# Patient Record
Sex: Female | Born: 1965
Health system: Southern US, Community
[De-identification: ages and names within clinical notes are randomized; demographics above are authoritative.]

## PROBLEM LIST (undated history)

## (undated) DIAGNOSIS — Z9289 Personal history of other medical treatment: Secondary | ICD-10-CM

## (undated) DIAGNOSIS — D219 Benign neoplasm of connective and other soft tissue, unspecified: Secondary | ICD-10-CM

## (undated) DIAGNOSIS — I10 Essential (primary) hypertension: Secondary | ICD-10-CM

## (undated) DIAGNOSIS — R0602 Shortness of breath: Secondary | ICD-10-CM

## (undated) DIAGNOSIS — G43909 Migraine, unspecified, not intractable, without status migrainosus: Secondary | ICD-10-CM

## (undated) DIAGNOSIS — D649 Anemia, unspecified: Secondary | ICD-10-CM

## (undated) DIAGNOSIS — M419 Scoliosis, unspecified: Secondary | ICD-10-CM

---

## 1998-01-24 ENCOUNTER — Ambulatory Visit (HOSPITAL_COMMUNITY): Admission: RE | Admit: 1998-01-24 | Discharge: 1998-01-24 | Payer: Self-pay | Admitting: Obstetrics and Gynecology

## 1999-01-15 ENCOUNTER — Encounter: Admission: RE | Admit: 1999-01-15 | Discharge: 1999-04-15 | Payer: Self-pay | Admitting: *Deleted

## 1999-03-03 ENCOUNTER — Encounter: Payer: Self-pay | Admitting: Emergency Medicine

## 1999-03-03 ENCOUNTER — Emergency Department (HOSPITAL_COMMUNITY): Admission: EM | Admit: 1999-03-03 | Discharge: 1999-03-03 | Payer: Self-pay | Admitting: Emergency Medicine

## 1999-03-08 ENCOUNTER — Inpatient Hospital Stay (HOSPITAL_COMMUNITY): Admission: AD | Admit: 1999-03-08 | Discharge: 1999-03-08 | Payer: Self-pay | Admitting: Obstetrics and Gynecology

## 1999-03-10 ENCOUNTER — Inpatient Hospital Stay (HOSPITAL_COMMUNITY): Admission: AD | Admit: 1999-03-10 | Discharge: 1999-03-10 | Payer: Self-pay | Admitting: Obstetrics and Gynecology

## 1999-08-11 ENCOUNTER — Inpatient Hospital Stay (HOSPITAL_COMMUNITY): Admission: AD | Admit: 1999-08-11 | Discharge: 1999-08-11 | Payer: Self-pay | Admitting: Obstetrics and Gynecology

## 1999-08-11 ENCOUNTER — Encounter: Payer: Self-pay | Admitting: Obstetrics and Gynecology

## 1999-08-15 ENCOUNTER — Inpatient Hospital Stay (HOSPITAL_COMMUNITY): Admission: AD | Admit: 1999-08-15 | Discharge: 1999-08-15 | Payer: Self-pay | Admitting: Obstetrics and Gynecology

## 1999-10-07 ENCOUNTER — Emergency Department (HOSPITAL_COMMUNITY): Admission: EM | Admit: 1999-10-07 | Discharge: 1999-10-07 | Payer: Self-pay | Admitting: Emergency Medicine

## 1999-10-31 ENCOUNTER — Encounter (INDEPENDENT_AMBULATORY_CARE_PROVIDER_SITE_OTHER): Payer: Self-pay

## 1999-10-31 ENCOUNTER — Inpatient Hospital Stay (HOSPITAL_COMMUNITY): Admission: AD | Admit: 1999-10-31 | Discharge: 1999-11-04 | Payer: Self-pay | Admitting: Obstetrics and Gynecology

## 2000-10-18 ENCOUNTER — Emergency Department (HOSPITAL_COMMUNITY): Admission: EM | Admit: 2000-10-18 | Discharge: 2000-10-18 | Payer: Self-pay | Admitting: Emergency Medicine

## 2001-07-01 ENCOUNTER — Ambulatory Visit (HOSPITAL_COMMUNITY): Admission: RE | Admit: 2001-07-01 | Discharge: 2001-07-01 | Payer: Self-pay | Admitting: Family Medicine

## 2001-07-01 ENCOUNTER — Encounter: Payer: Self-pay | Admitting: Family Medicine

## 2002-01-05 ENCOUNTER — Encounter: Admission: RE | Admit: 2002-01-05 | Discharge: 2002-01-05 | Payer: Self-pay | Admitting: Family Medicine

## 2002-01-05 ENCOUNTER — Encounter: Payer: Self-pay | Admitting: Family Medicine

## 2003-06-20 ENCOUNTER — Other Ambulatory Visit: Admission: RE | Admit: 2003-06-20 | Discharge: 2003-06-20 | Payer: Self-pay | Admitting: Obstetrics and Gynecology

## 2005-12-17 ENCOUNTER — Other Ambulatory Visit: Admission: RE | Admit: 2005-12-17 | Discharge: 2005-12-17 | Payer: Self-pay | Admitting: Obstetrics and Gynecology

## 2006-02-12 ENCOUNTER — Ambulatory Visit (HOSPITAL_COMMUNITY): Admission: RE | Admit: 2006-02-12 | Discharge: 2006-02-12 | Payer: Self-pay | Admitting: Obstetrics and Gynecology

## 2006-02-19 ENCOUNTER — Encounter: Admission: RE | Admit: 2006-02-19 | Discharge: 2006-02-19 | Payer: Self-pay | Admitting: Obstetrics and Gynecology

## 2007-02-18 ENCOUNTER — Encounter: Admission: RE | Admit: 2007-02-18 | Discharge: 2007-02-18 | Payer: Self-pay | Admitting: Obstetrics and Gynecology

## 2009-05-22 ENCOUNTER — Ambulatory Visit: Payer: Self-pay | Admitting: Internal Medicine

## 2009-06-05 ENCOUNTER — Inpatient Hospital Stay: Payer: Self-pay | Admitting: Internal Medicine

## 2009-06-11 ENCOUNTER — Ambulatory Visit: Payer: Self-pay | Admitting: Internal Medicine

## 2009-06-22 ENCOUNTER — Ambulatory Visit: Payer: Self-pay | Admitting: Internal Medicine

## 2009-06-28 ENCOUNTER — Ambulatory Visit: Payer: Self-pay | Admitting: Internal Medicine

## 2009-07-23 ENCOUNTER — Ambulatory Visit: Payer: Self-pay | Admitting: Internal Medicine

## 2009-08-20 ENCOUNTER — Ambulatory Visit: Payer: Self-pay | Admitting: Internal Medicine

## 2009-11-20 ENCOUNTER — Ambulatory Visit: Payer: Self-pay | Admitting: Internal Medicine

## 2009-11-21 ENCOUNTER — Ambulatory Visit: Payer: Self-pay | Admitting: Internal Medicine

## 2009-11-26 ENCOUNTER — Inpatient Hospital Stay: Payer: Self-pay | Admitting: Internal Medicine

## 2009-12-20 ENCOUNTER — Ambulatory Visit: Payer: Self-pay | Admitting: Internal Medicine

## 2010-01-20 ENCOUNTER — Ambulatory Visit: Payer: Self-pay | Admitting: Internal Medicine

## 2010-02-20 ENCOUNTER — Ambulatory Visit: Payer: Self-pay | Admitting: Internal Medicine

## 2010-06-04 ENCOUNTER — Ambulatory Visit: Payer: Self-pay | Admitting: Internal Medicine

## 2010-06-22 ENCOUNTER — Ambulatory Visit: Payer: Self-pay | Admitting: Internal Medicine

## 2010-07-13 ENCOUNTER — Encounter: Payer: Self-pay | Admitting: Obstetrics and Gynecology

## 2010-11-07 NOTE — Discharge Summary (Signed)
[facility]  Patient:    Kelly Hartman, Kelly Hartman                     MRN: [id number] Adm. Date:  [id number] Disc. Date: 11/04/99 Attending:  Trevor Iha Dictator:   Danie Chandler, R.N.                           Discharge Summary  ADMISSION DIAGNOSES:          1. Intrauterine pregnancy at [redacted] weeks gestation.                               2. Previous cesarean section for repeat.                               3. Desires sterility.  DISCHARGE DIAGNOSES:          1. Intrauterine pregnancy at [redacted] weeks gestation.                               2. Previous cesarean section for repeat.                               3. Desires sterility.                               4. Anemia.  PROCEDURE:                    On Oct 31, 1999, repeat low transverse cesarean section and [place] bilateral tubal ligation.  REASON FOR ADMISSION:         The patient is a 45 year old, gravida 3, para 1, t 39 weeks, who presents for repeat cesarean section.  Previous cesarean section as done for cephalopelvic disproportion and she desires repeat.  Also, the patient has multiparity and desires sterility.  HOSPITAL COURSE:              The patient was taken to the operating room and underwent the above named procedure without complications.  This was productive of a viable female infant with Apgars of 9 at one minute and 9 at five minutes and an arterial cord pH of 7.33.  Postoperatively, the patient did well.  On postoperative day #1, the patients hemoglobin was 8.5, hematocrit 26.3, and white blood cell count 10.1.  She had good return of bowel function on this day and she was started on iron daily.  On postoperative day #3, the patient was complaining of some dysuria.  She was started on Pyridium.  She had good pain control and was ambulating well without difficulty.  She also was tolerating a regular diet. She was discharged home on postoperative day #4.  Her dysuria had  improved.  CONDITION ON DISCHARGE:       Good.  DIET:                         Regular as tolerated.  ACTIVITY:                     No heavy lifting, no driving, and no vaginal entry.  She is to follow up in the office in one to two weeks for incision check and she is to call for a temperature greater than 100 degrees, persistent nausea and vomiting, heavy vaginal bleeding, and/or redness or drainage from the incision site.  DISCHARGE MEDICATIONS:        1. Pyridium 200 mg #10 one p.o. t.i.d. p.r.n.                               2. Tylox #40 one to two p.o. q.4h. p.r.n.                               3. Nu-Iron 150 mg #20 one p.o. q.d.                               4. Prenatal vitamins one p.o. q.d. DD:  11/04/99 TD:  11/04/99 Job: [id number]

## 2010-11-07 NOTE — H&P (Signed)
[facility]  Patient:    Kelly Hartman, Kelly Hartman                     MRN: [id number] Adm. Date:  [id number] Disc. Date: [id number] Attending:  Lorre Nick                         History and Physical  HISTORY OF PRESENT ILLNESS:   Ms. Bardwell is a 45 year old G3, P1, A1, at 39 weeks, who presents for repeat cesarean section.  Her primary cesarean section was in 1990, for failure to progress after seven hours of labor, of an eight pound infant.  She desire routine cesarean section (been complicated by advanced maternal age).  The patient declined amniocentesis and had A&P tetrad drawn, which was normal.  Estimated date of confinement is Nov 10, 1999. Group B Strep was positive.  PAST MEDICAL HISTORY:         Significant for history of fibroids and scoliosis.  PAST SURGICAL HISTORY:        Significant for hysteroscopy D&C and also cesarean section.  MEDICATIONS:                  Prenatal vitamins.  ALLERGIES:                    She has no known drug allergies.  PHYSICAL EXAMINATION:  VITAL SIGNS:                  Blood pressure is 110/62.  HEART:                        Regular rate and rhythm.  LUNGS:                        Clear to auscultation bilaterally.  ABDOMEN:                      Gravid, nontender, vertex.  Cervix is closed, thick, and high.  IMPRESSION/PLAN:              Previous cesarean section, desire repeat.  The patient also has multiparity and desires sterility.  I discussed the risks and benefits of cesarean section and bilateral tubal ligation with the patient and her husband at length, including, but not limited to risks of infection, bleeding, damage to the uterus, tubes, ovaries, bowel, or bladder.  Also, risk of tubal failure, quoted at 5 out of 1000.  The patient and her husband give their informed consent. DD:  10/31/99 TD:  10/31/99 Job: [id number]

## 2010-11-07 NOTE — Op Note (Signed)
[facility]  Patient:    Kelly Hartman, Kelly Hartman                     MRN: [id number] Proc. Date: 10/31/99 Adm. Date:  [id number] Attending:  Trevor Iha                           Operative Report  PREOPERATIVE DIAGNOSIS:       Intrauterine pregnancy at 39 weeks.  Previous cesarean section for repeat, and desires sterility.  POSTOPERATIVE DIAGNOSIS:      Intrauterine pregnancy at 39 weeks.  Previous cesarean section for repeat, and desires sterility.  OPERATION:                    Repeat low transverse cesarean section and [place] bilateral tubal ligation.  SURGEON:                      Trevor Iha, M.D.  ASSISTANT:  ANESTHESIA:                   Spinal.  ESTIMATED BLOOD LOSS:         800 cc.  INDICATIONS:                  [name] is a 45 year old, gravida 3, para 1, at 61 weeks who presents for repeat cesarean section.  Previous cesarean section for cephalopelvic disproportion.  She desires repeat.  Also, the patient has multiparity and desires sterility.  The risks and benefits were discussed at length.  Informed consent was obtained.  Failure rate of 5 out of 1000  was quoted to the patient.  FINDINGS:                     A viable female infant, Apgars 9 and 9, weight 6 pounds 9 ounces, pH 7.33 arterial.  DESCRIPTION OF PROCEDURE:     After adequate analgesia, the patient was placed n the supine position with a left lateral tilt.  She was sterilely prepped and draped.  Foley catheter was sterilely placed.  Previous Pfannenstiel scar was excised and the scar was revised sharply.  The fascia was taken down sharply in the midline which was incised transversely and extended superiorly and inferiorly off the bellies of the rectus muscle which were separated sharply in the midline. he peritoneum was then entered sharply.  The bladder blade was placed.  The uterine serosa was elevated, nicked, and incised transversely.  The  bladder flap was created and placed behind the bladder blade.  A low segment myotomy incision was made down to the infants vertex, extended laterally with the operators fingertips.  Amniotomy was performed and the vertex was delivered.  The nares and pharynx were then suctioned.  The infant was then delivered, cord clamped, and handed to pediatricians.  Cord blood was then obtained and the placenta extracted manually.  The uterus was exteriorized, wiped clean with a dry lap.  The myotomy was closed with a single locking layer of 0 Monocryl.  Hemostasis was achieved ith figure-of-eight sutures of 0 Monocryl.  Bilateral tubal ligation was then performed.  The right and left fallopian tubes were identified by the fimbriated end of the tube.  Midportions of the tube were grasped.  Avascular window was noted and two sutures of 0 plain were placed, doubly ligated, and central portion excised.  Approximately 1.5 cm of  tube was excised, and a suture of 2-0 silk was ligated on the proximal portion of the tubes.  Again this was done on both tubes in a similar fashion with good hemostasis.  The uterus was then placed back into the peritoneal cavity and after a copious amount of irrigation was performed, and adequate hemostasis was assured, the peritoneum was then closed with 0 Monocryl. The rectus muscles were plicated in the midline.  Irrigation was once again applied and after adequate hemostasis the fascia was closed with 0 Vicryl in a running fashion.  Irrigation was once again applied and after adequate hemostasis, the kin was stapled, Steri-Strips applied, and the patient received 1 gram of cefotetan  after delivery of the placenta.  Estimated blood loss was 800 cc.  The patient as stable on transfer to the recovery room. DD:  10/31/99 TD:  11/03/99 Job: [id number]

## 2011-04-21 ENCOUNTER — Emergency Department (HOSPITAL_COMMUNITY): Payer: BC Managed Care – PPO

## 2011-04-21 ENCOUNTER — Emergency Department (HOSPITAL_COMMUNITY)
Admission: EM | Admit: 2011-04-21 | Discharge: 2011-04-22 | Disposition: A | Discharge status: Home / Self Care | Payer: BC Managed Care – PPO | Attending: Emergency Medicine | Admitting: Emergency Medicine

## 2011-04-21 DIAGNOSIS — R0602 Shortness of breath: Secondary | ICD-10-CM | POA: Insufficient documentation

## 2011-04-21 DIAGNOSIS — R079 Chest pain, unspecified: Secondary | ICD-10-CM | POA: Insufficient documentation

## 2011-04-22 LAB — CBC
HCT: 38.8 % (ref 36.0–46.0)
Hemoglobin: 13.4 g/dL (ref 12.0–15.0)
MCHC: 34.5 g/dL (ref 30.0–36.0)
MCV: 87.6 fL (ref 78.0–100.0)
RDW: 13 % (ref 11.5–15.5)
WBC: 7.3 10*3/uL (ref 4.0–10.5)

## 2011-04-22 LAB — COMPREHENSIVE METABOLIC PANEL
ALT: 10 U/L (ref 0–35)
AST: 14 U/L (ref 0–37)
Alkaline Phosphatase: 110 U/L (ref 39–117)
CO2: 28 mEq/L (ref 19–32)
Calcium: 9.5 mg/dL (ref 8.4–10.5)
Chloride: 102 mEq/L (ref 96–112)
GFR calc Af Amer: >90 mL/min (ref >90)
GFR calc non Af Amer: >90 mL/min (ref >90)
Glucose, Bld: 93 mg/dL (ref 70–99)
Sodium: 138 mEq/L (ref 135–145)
Total Bilirubin: 0.3 mg/dL (ref 0.3–1.2)

## 2011-04-22 LAB — DIFFERENTIAL
Eosinophils Relative: 2 % (ref 0–5)
Lymphocytes Relative: 44 % (ref 12–46)
Lymphs Abs: 3.2 10*3/uL (ref 0.7–4.0)
Monocytes Absolute: 0.6 10*3/uL (ref 0.1–1.0)
Neutro Abs: 3.3 10*3/uL (ref 1.7–7.7)

## 2011-04-22 LAB — POCT I-STAT TROPONIN I

## 2011-05-21 ENCOUNTER — Ambulatory Visit: Payer: BC Managed Care – PPO | Admitting: Family Medicine

## 2012-03-22 ENCOUNTER — Encounter (HOSPITAL_COMMUNITY): Payer: Self-pay | Admitting: Pharmacist

## 2012-03-29 ENCOUNTER — Other Ambulatory Visit: Payer: Self-pay | Admitting: Obstetrics

## 2012-03-31 ENCOUNTER — Inpatient Hospital Stay (HOSPITAL_COMMUNITY): Admission: RE | Admit: 2012-03-31 | Discharge: 2012-03-31 | Payer: BC Managed Care – PPO | Source: Ambulatory Visit

## 2012-03-31 NOTE — Patient Instructions (Signed)
   Your procedure is scheduled ZO:[id number] October 17th  Enter through the Main Entrance of [facility] at:6am Pick up the phone at the desk and dial [phone] and inform us of your arrival.  Please call this number if you have any problems the morning of surgery: [phone]  Remember: Do not eat or drink anything after midnight on Wednesday  Do not wear jewelry, make-up, or FINGER nail polish No metal in your hair or on your body. Do not wear lotions, powders, perfumes. You may wear deodorant.  Please use your CHG wash as directed prior to surgery.  Do not shave anywhere for at least 12 hours prior to first CHG shower.  Do not bring valuables to the hospital. Contacts, dentures or bridgework may not be worn into surgery.  Leave suitcase in the car. After Surgery it may be brought to your room. For patients being admitted to the hospital, checkout time is 11:00am the day of discharge.

## 2012-04-05 ENCOUNTER — Encounter (HOSPITAL_COMMUNITY): Payer: Self-pay

## 2012-04-05 ENCOUNTER — Encounter (HOSPITAL_COMMUNITY)
Admission: RE | Admit: 2012-04-05 | Discharge: 2012-04-05 | Disposition: A | Discharge status: Home / Self Care | Payer: 59 | Source: Ambulatory Visit | Attending: Obstetrics | Admitting: Obstetrics

## 2012-04-05 HISTORY — DX: Anemia, unspecified: D64.9

## 2012-04-05 HISTORY — DX: Shortness of breath: R06.02

## 2012-04-05 HISTORY — DX: Personal history of other medical treatment: Z92.89

## 2012-04-05 LAB — CBC
HCT: 29.5 % — ABNORMAL LOW (ref 36.0–46.0)
MCH: 21 pg — ABNORMAL LOW (ref 26.0–34.0)
MCHC: 30.2 g/dL (ref 30.0–36.0)
MCV: 69.6 fL — ABNORMAL LOW (ref 78.0–100.0)
Platelets: 451 10*3/uL — ABNORMAL HIGH (ref 150–400)
RDW: 18.3 % — ABNORMAL HIGH (ref 11.5–15.5)
WBC: 7.2 10*3/uL (ref 4.0–10.5)

## 2012-04-05 NOTE — Patient Instructions (Addendum)
   Your procedure is scheduled on: Thursday October 17th  Enter through the Main Entrance of [facility] at:6am Pick up the phone at the desk and dial [phone] and inform us of your arrival.  Please call this number if you have any problems the morning of surgery: [phone]  Remember: Do not eat or drink anything after midnight on Wednesday  Do not wear jewelry, make-up, or FINGER nail polish No metal in your hair or on your body. Do not wear lotions, powders, perfumes. You may wear deodorant.  Please use your CHG wash as directed prior to surgery.  Do not shave anywhere for at least 12 hours prior to first CHG shower.  Do not bring valuables to the hospital.  Leave suitcase in the car. After Surgery it may be brought to your room. For patients being admitted to the hospital, checkout time is 11:00am the day of discharge.

## 2012-04-07 ENCOUNTER — Encounter (HOSPITAL_COMMUNITY): Payer: Self-pay | Admitting: Physician Assistant

## 2012-04-07 ENCOUNTER — Inpatient Hospital Stay (HOSPITAL_COMMUNITY): Payer: 59

## 2012-04-07 ENCOUNTER — Other Ambulatory Visit: Payer: Self-pay

## 2012-04-07 ENCOUNTER — Inpatient Hospital Stay (HOSPITAL_COMMUNITY): Payer: 59 | Admitting: Registered Nurse

## 2012-04-07 ENCOUNTER — Encounter (HOSPITAL_COMMUNITY): Payer: Self-pay | Admitting: Registered Nurse

## 2012-04-07 ENCOUNTER — Inpatient Hospital Stay (HOSPITAL_COMMUNITY): Admission: RE | Admit: 2012-04-07 | Payer: 59 | Source: Ambulatory Visit | Admitting: Cardiovascular Disease

## 2012-04-07 ENCOUNTER — Encounter (HOSPITAL_COMMUNITY)
Admission: RE | Disposition: A | Discharge status: Home / Self Care | Payer: Self-pay | Source: Ambulatory Visit | Attending: Obstetrics

## 2012-04-07 ENCOUNTER — Inpatient Hospital Stay (HOSPITAL_COMMUNITY)
Admission: RE | Admit: 2012-04-07 | Discharge: 2012-04-09 | DRG: 742 | Disposition: A | Discharge status: Home / Self Care | Payer: 59 | Source: Ambulatory Visit | Attending: Obstetrics | Admitting: Obstetrics

## 2012-04-07 DIAGNOSIS — N920 Excessive and frequent menstruation with regular cycle: Secondary | ICD-10-CM | POA: Diagnosis present

## 2012-04-07 DIAGNOSIS — R0602 Shortness of breath: Secondary | ICD-10-CM | POA: Diagnosis not present

## 2012-04-07 DIAGNOSIS — I369 Nonrheumatic tricuspid valve disorder, unspecified: Secondary | ICD-10-CM

## 2012-04-07 DIAGNOSIS — D219 Benign neoplasm of connective and other soft tissue, unspecified: Secondary | ICD-10-CM | POA: Diagnosis present

## 2012-04-07 DIAGNOSIS — Z886 Allergy status to analgesic agent status: Secondary | ICD-10-CM

## 2012-04-07 DIAGNOSIS — R110 Nausea: Secondary | ICD-10-CM | POA: Diagnosis not present

## 2012-04-07 DIAGNOSIS — D649 Anemia, unspecified: Secondary | ICD-10-CM | POA: Diagnosis present

## 2012-04-07 DIAGNOSIS — Z888 Allergy status to other drugs, medicaments and biological substances status: Secondary | ICD-10-CM

## 2012-04-07 DIAGNOSIS — D62 Acute posthemorrhagic anemia: Secondary | ICD-10-CM | POA: Diagnosis not present

## 2012-04-07 DIAGNOSIS — D259 Leiomyoma of uterus, unspecified: Principal | ICD-10-CM | POA: Diagnosis present

## 2012-04-07 DIAGNOSIS — E876 Hypokalemia: Secondary | ICD-10-CM | POA: Diagnosis present

## 2012-04-07 DIAGNOSIS — R072 Precordial pain: Secondary | ICD-10-CM

## 2012-04-07 DIAGNOSIS — R0789 Other chest pain: Secondary | ICD-10-CM | POA: Diagnosis not present

## 2012-04-07 HISTORY — PX: ABDOMINAL HYSTERECTOMY: SHX81

## 2012-04-07 HISTORY — DX: Benign neoplasm of connective and other soft tissue, unspecified: D21.9

## 2012-04-07 LAB — BASIC METABOLIC PANEL
BUN: 5 mg/dL — ABNORMAL LOW (ref 6–23)
CO2: 24 mEq/L (ref 19–32)
Chloride: 101 mEq/L (ref 96–112)
Creatinine, Ser: 0.58 mg/dL (ref 0.50–1.10)
Potassium: 3.1 mEq/L — ABNORMAL LOW (ref 3.5–5.1)

## 2012-04-07 LAB — CK TOTAL AND CKMB (NOT AT ARMC)
CK, MB: 1.6 ng/mL (ref 0.3–4.0)
Total CK: 195 U/L — ABNORMAL HIGH (ref 7–177)
Total CK: 338 U/L — ABNORMAL HIGH (ref 7–177)

## 2012-04-07 LAB — CBC
HCT: 25.9 % — ABNORMAL LOW (ref 36.0–46.0)
HCT: 25.7 % — ABNORMAL LOW (ref 36.0–46.0)
Hemoglobin: 8.2 g/dL — ABNORMAL LOW (ref 12.0–15.0)
Hemoglobin: 7.9 g/dL — ABNORMAL LOW (ref 12.0–15.0)
MCHC: 30.7 g/dL (ref 30.0–36.0)
MCV: 68.3 fL — ABNORMAL LOW (ref 78.0–100.0)
MCV: 68.7 fL — ABNORMAL LOW (ref 78.0–100.0)
RBC: 3.79 MIL/uL — ABNORMAL LOW (ref 3.87–5.11)
RDW: 18.2 % — ABNORMAL HIGH (ref 11.5–15.5)
WBC: 18.3 10*3/uL — ABNORMAL HIGH (ref 4.0–10.5)

## 2012-04-07 SURGERY — HYSTERECTOMY, ABDOMINAL
Anesthesia: General | Site: Abdomen | Wound class: Clean Contaminated

## 2012-04-07 MED ORDER — PANTOPRAZOLE SODIUM 40 MG PO TBEC
40.0000 mg | DELAYED_RELEASE_TABLET | Freq: Every day | ORAL | Status: DC
  Administered 2012-04-07 – 2012-04-09 (×3): 40 mg via ORAL
  Filled 2012-04-07 (×4): qty 1

## 2012-04-07 MED ORDER — PROPOFOL 10 MG/ML IV EMUL
INTRAVENOUS | Status: DC | PRN
  Administered 2012-04-07: 150 mg via INTRAVENOUS

## 2012-04-07 MED ORDER — CEFAZOLIN SODIUM-DEXTROSE 2-3 GM-% IV SOLR
2.0000 g | Freq: Once | INTRAVENOUS | Status: AC
  Administered 2012-04-07: 2 g via INTRAVENOUS
  Filled 2012-04-07: qty 50

## 2012-04-07 MED ORDER — GLYCOPYRROLATE 0.2 MG/ML IJ SOLN
INTRAMUSCULAR | Status: AC
  Filled 2012-04-07: qty 1

## 2012-04-07 MED ORDER — CEFAZOLIN SODIUM-DEXTROSE 2-3 GM-% IV SOLR: 2.0000 g | INTRAVENOUS | Status: DC

## 2012-04-07 MED ORDER — NALOXONE HCL 0.4 MG/ML IJ SOLN: 0.4000 mg | INTRAMUSCULAR | Status: DC | PRN

## 2012-04-07 MED ORDER — METOPROLOL TARTRATE 12.5 MG HALF TABLET
12.5000 mg | ORAL_TABLET | Freq: Four times a day (QID) | ORAL | Status: DC
  Administered 2012-04-07 – 2012-04-08 (×5): 12.5 mg via ORAL
  Filled 2012-04-07 (×7): qty 1

## 2012-04-07 MED ORDER — LACTATED RINGERS IV SOLN
INTRAVENOUS | Status: DC
  Administered 2012-04-07 (×3): via INTRAVENOUS

## 2012-04-07 MED ORDER — ONDANSETRON HCL 4 MG/2ML IJ SOLN
INTRAMUSCULAR | Status: AC
  Filled 2012-04-07: qty 2

## 2012-04-07 MED ORDER — HYDROMORPHONE HCL PF 1 MG/ML IJ SOLN
INTRAMUSCULAR | Status: AC
  Filled 2012-04-07: qty 1

## 2012-04-07 MED ORDER — DOCUSATE SODIUM 100 MG PO CAPS
100.0000 mg | ORAL_CAPSULE | Freq: Every day | ORAL | Status: DC
  Administered 2012-04-07 – 2012-04-09 (×3): 100 mg via ORAL
  Filled 2012-04-07 (×3): qty 1

## 2012-04-07 MED ORDER — ROCURONIUM BROMIDE 50 MG/5ML IV SOLN
INTRAVENOUS | Status: AC
  Filled 2012-04-07: qty 1

## 2012-04-07 MED ORDER — ROCURONIUM BROMIDE 100 MG/10ML IV SOLN
INTRAVENOUS | Status: DC | PRN
  Administered 2012-04-07: 20 mg via INTRAVENOUS
  Administered 2012-04-07: 50 mg via INTRAVENOUS

## 2012-04-07 MED ORDER — ONDANSETRON HCL 4 MG/2ML IJ SOLN
INTRAMUSCULAR | Status: DC | PRN
  Administered 2012-04-07 (×2): 4 mg via INTRAVENOUS

## 2012-04-07 MED ORDER — BUPIVACAINE HCL (PF) 0.25 % IJ SOLN
INTRAMUSCULAR | Status: AC
  Filled 2012-04-07: qty 30

## 2012-04-07 MED ORDER — ONDANSETRON HCL 4 MG/2ML IJ SOLN
4.0000 mg | Freq: Four times a day (QID) | INTRAMUSCULAR | Status: DC | PRN
  Administered 2012-04-07: 4 mg via INTRAVENOUS
  Filled 2012-04-07: qty 2

## 2012-04-07 MED ORDER — ZOLPIDEM TARTRATE 5 MG PO TABS: 5.0000 mg | ORAL_TABLET | Freq: Every evening | ORAL | Status: DC | PRN

## 2012-04-07 MED ORDER — MIDAZOLAM HCL 5 MG/5ML IJ SOLN
INTRAMUSCULAR | Status: DC | PRN
  Administered 2012-04-07 (×2): 1 mg via INTRAVENOUS

## 2012-04-07 MED ORDER — SODIUM CHLORIDE 0.9 % IV SOLN
INTRAVENOUS | Status: DC
  Administered 2012-04-07 (×2): via INTRAVENOUS

## 2012-04-07 MED ORDER — ONDANSETRON HCL 4 MG/2ML IJ SOLN: 4.0000 mg | Freq: Four times a day (QID) | INTRAMUSCULAR | Status: DC | PRN

## 2012-04-07 MED ORDER — CEFAZOLIN SODIUM-DEXTROSE 2-3 GM-% IV SOLR
INTRAVENOUS | Status: AC
  Filled 2012-04-07: qty 50

## 2012-04-07 MED ORDER — FENTANYL CITRATE 0.05 MG/ML IJ SOLN
INTRAMUSCULAR | Status: AC
  Filled 2012-04-07: qty 5

## 2012-04-07 MED ORDER — HYDROMORPHONE HCL PF 1 MG/ML IJ SOLN
INTRAMUSCULAR | Status: DC | PRN
  Administered 2012-04-07: .5 mg via INTRAVENOUS
  Administered 2012-04-07: 0.5 mg via INTRAVENOUS
  Administered 2012-04-07: .25 mg via INTRAVENOUS
  Administered 2012-04-07: 1 mg via INTRAVENOUS
  Administered 2012-04-07: .5 mg via INTRAVENOUS
  Administered 2012-04-07: .25 mg via INTRAVENOUS

## 2012-04-07 MED ORDER — BUPIVACAINE HCL (PF) 0.25 % IJ SOLN
INTRAMUSCULAR | Status: DC | PRN
  Administered 2012-04-07: 10 mL

## 2012-04-07 MED ORDER — OXYCODONE-ACETAMINOPHEN 5-325 MG PO TABS
1.0000 | ORAL_TABLET | ORAL | Status: DC | PRN
  Administered 2012-04-08 (×2): 1 via ORAL
  Administered 2012-04-09: 2 via ORAL
  Filled 2012-04-07: qty 1
  Filled 2012-04-07: qty 2
  Filled 2012-04-07: qty 1

## 2012-04-07 MED ORDER — DEXAMETHASONE SODIUM PHOSPHATE 10 MG/ML IJ SOLN
INTRAMUSCULAR | Status: AC
  Filled 2012-04-07: qty 1

## 2012-04-07 MED ORDER — LIDOCAINE HCL (CARDIAC) 20 MG/ML IV SOLN
INTRAVENOUS | Status: AC
  Filled 2012-04-07: qty 5

## 2012-04-07 MED ORDER — ASPIRIN EC 81 MG PO TBEC
81.0000 mg | DELAYED_RELEASE_TABLET | Freq: Every day | ORAL | Status: DC
  Administered 2012-04-07 – 2012-04-08 (×2): 81 mg via ORAL
  Filled 2012-04-07 (×3): qty 1

## 2012-04-07 MED ORDER — POTASSIUM CHLORIDE CRYS ER 20 MEQ PO TBCR
40.0000 meq | EXTENDED_RELEASE_TABLET | Freq: Once | ORAL | Status: AC
  Administered 2012-04-07: 40 meq via ORAL
  Filled 2012-04-07: qty 2

## 2012-04-07 MED ORDER — NITROGLYCERIN IN D5W 200-5 MCG/ML-% IV SOLN
10.0000 ug/min | INTRAVENOUS | Status: DC
  Administered 2012-04-07: 15 ug/min via INTRAVENOUS
  Administered 2012-04-07: 20 ug/min via INTRAVENOUS
  Administered 2012-04-07: 10 ug/min via INTRAVENOUS
  Filled 2012-04-07: qty 250

## 2012-04-07 MED ORDER — DEXAMETHASONE SODIUM PHOSPHATE 4 MG/ML IJ SOLN
INTRAMUSCULAR | Status: DC | PRN
  Administered 2012-04-07: 10 mg via INTRAVENOUS

## 2012-04-07 MED ORDER — IOHEXOL 350 MG/ML SOLN
65.0000 mL | Freq: Once | INTRAVENOUS | Status: AC | PRN
  Administered 2012-04-07: 65 mL via INTRAVENOUS

## 2012-04-07 MED ORDER — NEOSTIGMINE METHYLSULFATE 1 MG/ML IJ SOLN
INTRAMUSCULAR | Status: AC
  Filled 2012-04-07: qty 10

## 2012-04-07 MED ORDER — MENTHOL 3 MG MT LOZG: 1.0000 | LOZENGE | OROMUCOSAL | Status: DC | PRN

## 2012-04-07 MED ORDER — LIDOCAINE HCL (CARDIAC) 20 MG/ML IV SOLN
INTRAVENOUS | Status: DC | PRN
  Administered 2012-04-07: 50 mg via INTRAVENOUS

## 2012-04-07 MED ORDER — SODIUM CHLORIDE 0.9 % IJ SOLN: 9.0000 mL | INTRAMUSCULAR | Status: DC | PRN

## 2012-04-07 MED ORDER — MIDAZOLAM HCL 2 MG/2ML IJ SOLN
INTRAMUSCULAR | Status: AC
  Filled 2012-04-07: qty 2

## 2012-04-07 MED ORDER — GLYCOPYRROLATE 0.2 MG/ML IJ SOLN
INTRAMUSCULAR | Status: DC | PRN
  Administered 2012-04-07 (×2): 0.2 mg via INTRAVENOUS

## 2012-04-07 MED ORDER — LORAZEPAM 2 MG/ML IJ SOLN
1.0000 mg | Freq: Once | INTRAMUSCULAR | Status: AC
  Administered 2012-04-08: 1 mg via INTRAVENOUS
  Filled 2012-04-07: qty 1

## 2012-04-07 MED ORDER — HYDROMORPHONE 0.3 MG/ML IV SOLN
INTRAVENOUS | Status: DC
  Administered 2012-04-07: 1.2 mg via INTRAVENOUS
  Administered 2012-04-07: 1.5 mg via INTRAVENOUS
  Administered 2012-04-07 (×2): via INTRAVENOUS
  Administered 2012-04-07: 0.6 mg via INTRAVENOUS
  Administered 2012-04-08: 0.9 mg via INTRAVENOUS
  Administered 2012-04-08: 0.6 mg via INTRAVENOUS
  Filled 2012-04-07 (×2): qty 25

## 2012-04-07 MED ORDER — NEOSTIGMINE METHYLSULFATE 1 MG/ML IJ SOLN
INTRAMUSCULAR | Status: DC | PRN
  Administered 2012-04-07: 3 mg via INTRAVENOUS

## 2012-04-07 MED ORDER — HYDROMORPHONE HCL PF 1 MG/ML IJ SOLN
0.2500 mg | INTRAMUSCULAR | Status: DC | PRN
  Administered 2012-04-07 (×3): 0.5 mg via INTRAVENOUS

## 2012-04-07 MED ORDER — DIPHENHYDRAMINE HCL 12.5 MG/5ML PO ELIX
12.5000 mg | ORAL_SOLUTION | Freq: Four times a day (QID) | ORAL | Status: DC | PRN
  Filled 2012-04-07: qty 5

## 2012-04-07 MED ORDER — ONDANSETRON HCL 4 MG PO TABS
4.0000 mg | ORAL_TABLET | Freq: Four times a day (QID) | ORAL | Status: DC | PRN
  Administered 2012-04-07: 4 mg via ORAL
  Filled 2012-04-07: qty 1

## 2012-04-07 MED ORDER — PROPOFOL 10 MG/ML IV EMUL
INTRAVENOUS | Status: AC
  Filled 2012-04-07: qty 20

## 2012-04-07 MED ORDER — DIPHENHYDRAMINE HCL 50 MG/ML IJ SOLN: 12.5000 mg | Freq: Four times a day (QID) | INTRAMUSCULAR | Status: DC | PRN

## 2012-04-07 MED ORDER — KETOROLAC TROMETHAMINE 30 MG/ML IJ SOLN: 15.0000 mg | Freq: Once | INTRAMUSCULAR | Status: DC | PRN

## 2012-04-07 SURGICAL SUPPLY — 53 items
ADH SKN CLS APL DERMABOND .7 (GAUZE/BANDAGES/DRESSINGS)
CANISTER SUCTION 2500CC (MISCELLANEOUS) ×3 IMPLANT
CHLORAPREP W/TINT 26ML (MISCELLANEOUS) ×2 IMPLANT
CLOTH BEACON ORANGE TIMEOUT ST (SAFETY) ×2 IMPLANT
CONT PATH 16OZ SNAP LID 3702 (MISCELLANEOUS) ×2 IMPLANT
CONTAINER PREFILL 10% NBF 60ML (FORM) IMPLANT
DECANTER SPIKE VIAL GLASS SM (MISCELLANEOUS) ×1 IMPLANT
DERMABOND ADVANCED (GAUZE/BANDAGES/DRESSINGS)
DERMABOND ADVANCED .7 DNX12 (GAUZE/BANDAGES/DRESSINGS) IMPLANT
DRSG COVADERM 4X10 (GAUZE/BANDAGES/DRESSINGS) ×1 IMPLANT
ELECT BLADE 6 FLAT ULTRCLN (ELECTRODE) IMPLANT
ELECT NDL TIP 2.8 STRL (NEEDLE) IMPLANT
ELECT NEEDLE TIP 2.8 STRL (NEEDLE) IMPLANT
ELECT REM PT RETURN 9FT ADLT (ELECTROSURGICAL) ×2
ELECTRODE REM PT RTRN 9FT ADLT (ELECTROSURGICAL) IMPLANT
GAUZE SPONGE 4X4 16PLY XRAY LF (GAUZE/BANDAGES/DRESSINGS) ×2 IMPLANT
GLOVE BIO SURGEON STRL SZ 6.5 (GLOVE) ×6 IMPLANT
GLOVE BIOGEL PI IND STRL 6.5 (GLOVE) IMPLANT
GLOVE BIOGEL PI IND STRL 7.0 (GLOVE) ×2 IMPLANT
GLOVE BIOGEL PI INDICATOR 6.5 (GLOVE) ×2
GLOVE BIOGEL PI INDICATOR 7.0 (GLOVE) ×5
GLOVE SURG SS PI 6.5 STRL IVOR (GLOVE) ×2 IMPLANT
GOWN PREVENTION PLUS LG XLONG (DISPOSABLE) ×7 IMPLANT
NDL HYPO 25X1 1.5 SAFETY (NEEDLE) ×1 IMPLANT
NEEDLE HYPO 25X1 1.5 SAFETY (NEEDLE) ×2 IMPLANT
NS IRRIG 1000ML POUR BTL (IV SOLUTION) ×3 IMPLANT
PACK ABDOMINAL GYN (CUSTOM PROCEDURE TRAY) ×2 IMPLANT
PAD ABD 7.5X8 STRL (GAUZE/BANDAGES/DRESSINGS) IMPLANT
PAD OB MATERNITY 4.3X12.25 (PERSONAL CARE ITEMS) ×2 IMPLANT
PROTECTOR NERVE ULNAR (MISCELLANEOUS) ×4 IMPLANT
SPONGE GAUZE 4X4 12PLY (GAUZE/BANDAGES/DRESSINGS) IMPLANT
SPONGE LAP 18X18 X RAY DECT (DISPOSABLE) ×4 IMPLANT
STAPLER VISISTAT 35W (STAPLE) ×2 IMPLANT
SUT MNCRL AB 4-0 PS2 18 (SUTURE) ×1 IMPLANT
SUT PLAIN 2 0 XLH (SUTURE) ×1 IMPLANT
SUT PLAIN 3 0 CT 1 27 (SUTURE) IMPLANT
SUT PROLENE 6 0 C 1 24 (SUTURE) ×1 IMPLANT
SUT VIC AB 0 CT1 27 (SUTURE) ×4
SUT VIC AB 0 CT1 27XBRD ANBCTR (SUTURE) ×2 IMPLANT
SUT VIC AB 2-0 CT1 27 (SUTURE)
SUT VIC AB 2-0 CT1 TAPERPNT 27 (SUTURE) IMPLANT
SUT VIC AB 2-0 SH 27 (SUTURE) ×2
SUT VIC AB 2-0 SH 27XBRD (SUTURE) IMPLANT
SUT VIC AB 3-0 SH 27 (SUTURE)
SUT VIC AB 3-0 SH 27X BRD (SUTURE) IMPLANT
SUT VICRYL #0 CTB 1 (SUTURE) ×6 IMPLANT
SUT VICRYL 1 TIES 12X18 (SUTURE) ×2 IMPLANT
SUT VICRYL 3 0 BR 18  UND (SUTURE)
SUT VICRYL 3 0 BR 18 UND (SUTURE) IMPLANT
SYR CONTROL 10ML LL (SYRINGE) ×2 IMPLANT
TOWEL OR 17X24 6PK STRL BLUE (TOWEL DISPOSABLE) ×4 IMPLANT
TRAY FOLEY CATH 14FR (SET/KITS/TRAYS/PACK) ×2 IMPLANT
WATER STERILE IRR 1000ML POUR (IV SOLUTION) ×2 IMPLANT

## 2012-04-07 NOTE — Progress Notes (Signed)
[facility] arrived to transfer pt. To [facility] bed # 2904. Pt. To go directly to CT upon arrival to [facility] before going to 2900.

## 2012-04-07 NOTE — Progress Notes (Signed)
  Echocardiogram 2D Echocardiogram has been performed.  Kelly Hartman 04/07/2012, 6:46 PM

## 2012-04-07 NOTE — H&P (Signed)
See full H&P in scanned docs. In short, large fibroid uterus w/ menorrhagia and anemia. 1100 grams. Planning ovarian retention, no signif medical problems.  Madeliene Tejera A. 04/07/2012 7:33 AM

## 2012-04-07 NOTE — Progress Notes (Signed)
Patient transferred from [facility] to [facility] tonight for further evaluation of her chest pain.  According to the patient, she has pressure type left sided chest discomfort that started shortly after her surgery and has been there constantly and improved with IV dilaudid and NTG.  It is associated with dyspnea and anxiety.  Her EKGs show T wave inversion in V1-V3 and cardiac enzymes x 2 have been negative.  Echocardiogram was also obtained that was unremarkable.  Given this, suspicion for cardiac etiology of her discomfort is not clear at this point.  A  CT chest was obtained that ruled out PE.  At this point, the patient is comfortable and her symptoms have subsided significantly.  Will continue to treat her pain tonight and continue to follow cardiac enzymes.  If her symptoms acutely worsen, may consider performing coronary angiogram.  Her bleeding risk is probably high given that she just had a operation and had some blood loss requiring 1 U PRBC transfusion today.  Thus, would opt to continue conservative management at this time.

## 2012-04-07 NOTE — Progress Notes (Signed)
POD # 0  Pt notes chest pain 5/10, just started nitroglycerin drip, baby ASA, beta blocker  Pt sleepy, minimal abdominal pain.  PE: Vitals per unit Gen: sleepy Abd: soft, approp tender LE: SCDs in place  A/P: POD # 0 TAH w/ post-op chest pain  - chest pain. being managed by cardiology. Nl CXR, echo pending, EKG changes. Appreciate cardiology management. If pt needs anticoagulation to allow assessment of cardiac issues, would go ahead as while pt has risks of post-op bleeding (esp given large fibroids and chronic anemia), need to protect cardiac status.   - anemia. Chronic. Small EBL at OR. S/p 1 unit pRBC  Kelly Hartman A. 04/07/2012 7:11 PM   [phone] (cell)

## 2012-04-07 NOTE — Progress Notes (Signed)
Post-op day #0  Healthy, 46 yo G2P2 who underwent TAH this am for enlarged fibroid uterus w/ h/o menorrhagia and anemia. Uncomplicated TAH w/ EBL of 400cc. Starting Hgb 8.5.  In the PACU pt noted chest pain, EKG was done w/ T wave changes in anterolateral leads. Pt placed on O2 and sent to AICU for recovery.  Pt notes "chest pressure"  And "feeling tired in her chest." Abdominal pain controlled w/ Dilaudid PCA.  PE:  Filed Vitals:   04/07/12 1215 04/07/12 1228 04/07/12 1244 04/07/12 1249  BP: 134/70 120/84    Pulse: 75 67 78   Temp:  97.8 F (36.6 C)    TempSrc:      Resp: 20 18 20 16   SpO2: 100% 100% 99% 100%   Tull 2 L in place Gen: sleepy but arousable, maintains eye contact and converses but falls to sleep when not engaged CV: RRR Pulm: CTAB Abd: non-distended, appropriately tender Inc: dressed, small staining. LE: SCD/s in place  CBC    Component Value Date/Time   WBC 20.5* 04/07/2012 1259   RBC 3.74* 04/07/2012 1259   HGB 7.9* 04/07/2012 1259   HCT 25.7* 04/07/2012 1259   PLT 426* 04/07/2012 1259   MCV 68.7* 04/07/2012 1259   MCH 21.1* 04/07/2012 1259   MCHC 30.7 04/07/2012 1259   RDW 18.2* 04/07/2012 1259   LYMPHSABS 3.2 04/21/2011 2355   MONOABS 0.6 04/21/2011 2355   EOSABS 0.1 04/21/2011 2355   BASOSABS 0.1 04/21/2011 2355    A/P: Post-op day 0 from TAH, uncomplicated TAH, now w chest pain - chest pain. Possible EKG changes. Have consulted critical care who may consult w/ cardiology. They are awaiting EKG for review. Pt on tele and O2. Pain controlled w/ Dilaudid. Pt does not have medical anticoagualtion pre-op due to her anemia and risk of blood loss during OR. SCD's in place. DDx includes normal post-op, anemia, MI, DVT. Will transfuse 1 unit pRBC due to anemia. Check CXR and await critical care consultation/  Zhana Jeangilles A. 04/07/2012 1:40 PM

## 2012-04-07 NOTE — Op Note (Signed)
04/07/2012  10:17 AM  PATIENT:  Kelly Hartman  46 y.o. female  PRE-OPERATIVE DIAGNOSIS:  Menorrhagia, Fibroids  POST-OPERATIVE DIAGNOSIS:  Menorrhagia, Fibroids  PROCEDURE:  Procedure(s) (LRB) with comments: HYSTERECTOMY ABDOMINAL (N/A) - Requests 2 hrs.  SURGEON:  Surgeon(s) and Role:    * Shemeika Starzyk A. Ernestina Penna, MD - Primary    * Genia Del, MD - Assisting  PHYSICIAN ASSISTANT:   ASSISTANTSSeymour Bars, MD   ANESTHESIA:   local and general  EBL:  Total I/O In: 2000 [I.V.:2000] Out: 850 [Urine:450; Blood:400]  BLOOD ADMINISTERED:none  DRAINS: Urinary Catheter (Foley)   LOCAL MEDICATIONS USED:  MARCAINE     SPECIMEN:  Source of Specimen:  uterus and cervix  DISPOSITION OF SPECIMEN:  PATHOLOGY  COUNTS:  YES  TOURNIQUET:  * No tourniquets in log *  DICTATION: .Note written in EPIC  PLAN OF CARE: Admit to inpatient   PATIENT DISPOSITION:  PACU - hemodynamically stable.   Delay start of Pharmacological VTE agent (>24hrs) due to surgical blood loss or risk of bleeding: yes  Findings: Normal bilateral ovaries, normal fallopian tubes, status post bilateral tubal ligation, enlarged 735 g uterus with multiple fibroids. Hemostasis postpartum. Bladder adhered to the lower uterine segment  Indications: This is a 46 year old G2 P2 with long history of menorrhagia and anemia who opted for abdominal hysterectomy with ovarian conservation after risks benefits of procedure were discussed and alternatives were considered.  Procedure: Patient was taken to the operating in the dorsal supine position after  room where general anesthesia was initiated without difficulty. she was prepped and draped sterilely after a Foley catheter was placed. A Pfannenstiel skin incision was made with the scalpel over the old prior Pfannenstiel scar. This was carried down with the Bovie cautery to the fascia. Due to her prior cesarean section x2 there was scar at the level of the fascia. The fascia was  incised in the midline incision was extended laterally with the Mayo scissors. The inferior aspect of the fascia was grasped with Coker clamps elevated up and the underlying rectus muscles were dissected off sharply the superior aspect of the fascial incision was grasped with Coker clamps elevated up and dissected off sharply. The peritoneum was grasped with Annaliese Saez clamps and the scalpel was used to enter the peritoneum sharply. The bladder was noted to be adhesed quite high. The peritoneal incision was extended superiorly and inferiorly with good visualization of the bladder and the peritoneal contents.  The pelvis was palpated. The fibroid uterus was noted. The fibroid uterus was delivered through the incision. Everitt Wenner clamps were placed on the cornual bilaterally. The right round ligament was grasped with a Babcock sutured and divided. A window was created in the mesial salpinx and a Cadden Elizondo clamp was placed across the uterine ovarian ligament this was then transected free tied and suture-ligated. Good hemostasis was noted a second clamp was placed on the utero-ovarian pedicle for hemostasis and to further free the right ovary. The right ovary was unable to drop laterally and using pickups and Metzenbaums further dissection was done. The bladder flap was created sharply. Attention was turned to left side where similar procedure was carried out to both transected the left round ligament and to divide the left utero-ovarian ligament. The left ovary was then dropped laterally. The anterior leaf of the broad ligament was divided sharply to create a bladder flap. Additional dissection was done to skeletonized the uterine arteries bilaterally.  A curved Heaney clamp was placed across the uterine arteries at  the level of the internal os this was transected and ligated. This was done bilaterally. The uterus was then transected at the level of the internal os with the Bovie cautery. The specimen was removed. Additional  dissection was done to take the bladder flap down from the cervical tissue. The cardinal ligament was serially transected and suture-ligated. Once at the level of the external os the upper vagina was clamped with curved Heaney clamps and the cervix was transected. Heaney sutures were placed at the ankles bilaterally an additional 3 figure-of-eight sutures are placed in the midpoint of the vaginal cuff. Good hemostasis was noted. A small amount of bleeding was noted from the posterior dissection and this was controlled with pulse Bovie cautery and suture.  All cuff angles, cuff and ovarian pedicles were then reinspected and found to be hemostatic. The pelvis was irrigated with warm normal saline. And the procedure was terminated. All tagged pedicles were released. The underside of the fascia and the muscle were inspected found to be hemostatic. Small amount of cautery was needed to help with hemostasis. The fascia closed with 0 Vicryl in 2 halves in a running fashion. The subcutaneous tissue was closed with a 20 plain suture and the skin was closed with a 4-0 Vicryl in a subcuticular fashion  The patient tolerated the procedure well. Sponge lap and needle counts were correct x3 the patient was taken to the recovery room in a stable condition  Hilman Kissling A. 04/07/2012 10:44 AM

## 2012-04-07 NOTE — Anesthesia Postprocedure Evaluation (Signed)
Anesthesia Post Note  Patient: Kelly Hartman  Procedure(s) Performed: Procedure(s) (LRB): HYSTERECTOMY ABDOMINAL (N/A)  Anesthesia type: General  Patient location: PACU  Post pain: Pain level controlled  Post assessment: Post-op Vital signs reviewed  Last Vitals:  Filed Vitals:   04/07/12 1130  BP: 135/76  Pulse: 63  Temp:   Resp: 24    Post vital signs: Reviewed  Level of consciousness: sedated  Complications: No apparent anesthesia complications. The patient is complaining of 10/10 chest pain. An ECG was done and it showed anterolateral T wave changes which were different from 04/23/11 ECG when she R/O for MI. Pt will be d/c to AICU to R/O MI. Dr. Ernestina Penna is aware.

## 2012-04-07 NOTE — Progress Notes (Signed)
Dr. Jobe Marker requesting to speak with OB/GYN re: transfer to [facility] for further Cardiology evaluation and care

## 2012-04-07 NOTE — Preoperative (Signed)
Beta Blockers   Reason not to administer Beta Blockers:Not Applicable 

## 2012-04-07 NOTE — CV Procedure (Signed)
Preliminary Echo note:  [employer] computer system is currently down.  The echo tech brought the CD  To [facility] for me to view.  Normal LV function - EF 60%.  No segmental wall motion MV- trace - mild MR AoV - no AS , no AI TV- mild - moderate TR PV- trivial PI  RV - not well seen - does not appear to be dilated.  PA pressures are ~ normal. No pericardial effusion   Vesta Mixer, Montez Hageman., MD, [facility] 04/07/2012, 8:27 PM Office - [phone] Pager [phone]

## 2012-04-07 NOTE — Transfer of Care (Signed)
Immediate Anesthesia Transfer of Care Note  Patient: Kelly Hartman  Procedure(s) Performed: Procedure(s) (LRB) with comments: HYSTERECTOMY ABDOMINAL (N/A) - Requests 2 hrs.  Patient Location: PACU  Anesthesia Type: General  Level of Consciousness: sedated  Airway & Oxygen Therapy: Patient Spontanous Breathing  Post-op Assessment: Report given to PACU RN  Post vital signs: Reviewed  Complications: No apparent anesthesia complications

## 2012-04-07 NOTE — Anesthesia Preprocedure Evaluation (Signed)
Anesthesia Evaluation  Patient identified by MRN, date of birth, ID band Patient awake    Reviewed: Allergy & Precautions, H&P , NPO status , Patient's Chart, lab work & pertinent test results, reviewed documented beta blocker date and time   History of Anesthesia Complications Negative for: history of anesthetic complications  Airway Mallampati: I TM Distance: >3 FB Neck ROM: full    Dental  (+) Teeth Intact   Pulmonary neg pulmonary ROS,  breath sounds clear to auscultation  Pulmonary exam normal       Cardiovascular negative cardio ROS  Rhythm:regular Rate:Normal     Neuro/Psych negative neurological ROS  negative psych ROS   GI/Hepatic negative GI ROS, Neg liver ROS,   Endo/Other  negative endocrine ROS  Renal/GU negative Renal ROS  Female GU complaint     Musculoskeletal   Abdominal   Peds  Hematology  (+) Blood dyscrasia (hgb 8.9), anemia , H/o blood transfusion x2   Anesthesia Other Findings Patient reports hallucinating and itching after epidural anesthesia for C/S (believes this is a fentanyl allergy)  Reproductive/Obstetrics negative OB ROS                           Anesthesia Physical Anesthesia Plan  ASA: I  Anesthesia Plan: General ETT   Post-op Pain Management:    Induction:   Airway Management Planned:   Additional Equipment:   Intra-op Plan:   Post-operative Plan:   Informed Consent: I have reviewed the patients History and Physical, chart, labs and discussed the procedure including the risks, benefits and alternatives for the proposed anesthesia with the patient or authorized representative who has indicated his/her understanding and acceptance.   Dental Advisory Given  Plan Discussed with: CRNA and Surgeon  Anesthesia Plan Comments:         Anesthesia Quick Evaluation

## 2012-04-07 NOTE — Brief Op Note (Signed)
04/07/2012  10:17 AM  PATIENT:  Gara Kroner  46 y.o. female  PRE-OPERATIVE DIAGNOSIS:  Menorrhagia, Fibroids  POST-OPERATIVE DIAGNOSIS:  Menorrhagia, Fibroids  PROCEDURE:  Procedure(s) (LRB) with comments: HYSTERECTOMY ABDOMINAL (N/A) - Requests 2 hrs.  SURGEON:  Surgeon(s) and Role:    * Aziyah Provencal A. Ernestina Penna, MD - Primary    * Genia Del, MD - Assisting  PHYSICIAN ASSISTANT:   ASSISTANTSSeymour Bars, MD   ANESTHESIA:   local and general  EBL:  Total I/O In: 2000 [I.V.:2000] Out: 850 [Urine:450; Blood:400]  BLOOD ADMINISTERED:none  DRAINS: Urinary Catheter (Foley)   LOCAL MEDICATIONS USED:  MARCAINE     SPECIMEN:  Source of Specimen:  uterus and cervix  DISPOSITION OF SPECIMEN:  PATHOLOGY  COUNTS:  YES  TOURNIQUET:  * No tourniquets in log *  DICTATION: .Note written in EPIC  PLAN OF CARE: Admit to inpatient   PATIENT DISPOSITION:  PACU - hemodynamically stable.   Delay start of Pharmacological VTE agent (>24hrs) due to surgical blood loss or risk of bleeding: yes

## 2012-04-07 NOTE — Consult Note (Signed)
CARDIOLOGY CONSULT NOTE   Patient ID: Stephanny Tsutsui MRN: [id number] DOB/AGE: 12-13-1965 47 y.o.  Admit date: 04/07/2012  Primary Physician   Urgent care on [address] Primary Cardiologist   None; Has been seen by [facility] 2011 Reason for Consultation   Chest pain, abnl ECG  WUJ:WJXBJYN Dollinger is a 46 y.o. female with no history of CAD. She was admitted for a hysterectomy from fibroids. The surgery was without complication, but afterward, she developed chest heaviness and systemic weakness, associated with SOB and nausea, no vomiting. It waxes and wanes. Ranges from 9/10 to a 2/10. Symptoms initially began about noon. The only episode of chest pain she has ever had was in October 2012, when she came to the ER and was discharged home. The patient remembers having a stress test in the past, but cannot remember exactly when (possibly 2011). The stress test was after she was admitted to [facility] with chest pain and seen there by one of the [facility] doctors. However, she has not followed up with them, and now lives in [place].  Past Medical History  Diagnosis Date  . Anemia   . Shortness of breath 2011    with exertion-d/t anemia - had stress test that was OK at [facility]  . History of blood transfusion 2010, 2011    [facility]     Past Surgical History  Procedure Date  . Cesarean section x2    Allergies  Allergen Reactions  . Aspirin Nausea Only  . Cyclobenzaprine Other (See Comments)    NO MUSCLE RELAXERS. Cause anxiety and hallucinations  . Fentanyl Itching    Itching and hallucinations from epidural    I have reviewed the patient's current medications    . ceFAZolin      .  ceFAZolin (ANCEF) IV  2 g Intravenous Once  . docusate sodium  100 mg Oral Daily  . HYDROmorphone      . HYDROmorphone      . HYDROmorphone PCA 0.3 mg/mL   Intravenous Q4H  . pantoprazole  40 mg Oral Daily  . potassium chloride  40 mEq Oral Once  .  DISCONTD:  ceFAZolin (ANCEF) IV  2 g Intravenous On Call to OR      . sodium chloride 125 mL/hr at 04/07/12 1314  . DISCONTD: lactated ringers     diphenhydrAMINE, diphenhydrAMINE, menthol-cetylpyridinium, naloxone, ondansetron (ZOFRAN) IV, ondansetron (ZOFRAN) IV, ondansetron, oxyCODONE-acetaminophen, sodium chloride, zolpidem, DISCONTD: bupivacaine, DISCONTD:  HYDROmorphone (DILAUDID) injection, DISCONTD: ketorolac  Medication Sig  ferrous gluconate (FERGON) 325 MG tablet Take 325 mg by mouth 2 (two) times daily with a meal.  docusate sodium (COLACE) 100 MG capsule Take 100 mg by mouth daily.  vitamin B-12 500 MCG tablet Take 500 mcg by mouth 2 times daily with a meal.     History   Social History  . Marital Status: Divorced    Spouse Name: N/A    Number of Children: N/A  . 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 Smoking status: Never Smoker   . Smokeless tobacco: Not on file  . Alcohol Use: No  . Drug Use: No  . Sexually Active:    Social History Narrative   Lives with daughter.   Family Status  Relation Status Death Age  . Mother Deceased 28    [place], no known CAD  . Father Deceased  53    No data  . Sister Alive     No known CAD  . Brother Alive     No known CAD  . Brother Alive     No known CAD  . Brother Alive     No known CAD     ROS: Has had several episodes, 1 fairly severe of brief SOB, no known cause, felt her heart rate increase. All symptoms resolved in a few seconds. She generally gets along well, has weakness and DOE when her H&H is low. No recent illnesses, no bleeding except vaginally. Full 14 point review of systems complete and found to be negative unless listed above.  Physical Exam: Blood pressure 138/85, pulse 64, temperature 97.9 F (36.6 C), temperature source Axillary, resp. rate 19, height 5\' 1"  (1.549 m), weight 136 lb 6.4 oz (61.871 kg), SpO2 95.00%.    General: Well developed, well nourished, female in mild distress from meds, not from chest pain. Head: Eyes PERRLA, No xanthomas.   Normocephalic and atraumatic, oropharynx without edema or exudate. Dentition: good Lungs: Clear bilaterally Heart: HRRR S1 S2, no rub/gallop,  No significant murmur. pulses are 2+ all 4 extrem.   Neck: No carotid bruits. No lymphadenopathy.  JVD not elevated. Abdomen: Bowel sounds present but decreased, abdomen soft and tender at incision without masses or hernias noted. Incision bandaged and not disturbed, minimal hemorrhage on dressing, marked by RN. Msk:  No spine or cva tenderness. No weakness, no joint deformities or effusions. Extremities: No clubbing or cyanosis.  edema.  Neuro: Alert and oriented X 3. No focal deficits noted. Psych:  Good affect, responds appropriately Skin: No rashes or lesions noted.  Labs:   Lab Results  Component Value Date   WBC 20.5* 04/07/2012   HGB 7.9* 04/07/2012   HCT 25.7* 04/07/2012   MCV 68.7* 04/07/2012   PLT 426* 04/07/2012      Lab 04/07/12 1259  NA 135  K 3.1*  CL 101  CO2 24  BUN 5*  CREATININE 0.58  CALCIUM 8.2*  PROT --  BILITOT --  ALKPHOS --  ALT --  AST --  GLUCOSE 197*    Basename 04/07/12 1201  CKTOTAL 195*195*  CKMB 1.6  TROPONINI <0.30   ECG:  04/07/2012 SR, T wave changes V4-6, different from 04/21/2011 ECG, of uncertain significance.  Radiology:  Dg Chest Port 1 View 04/07/2012  *RADIOLOGY REPORT*  Clinical Data: Chest pain.  PORTABLE CHEST - 1 VIEW  Comparison: Plain film chest 04/21/2011.  Findings: Lungs clear.  Heart size normal.  No pneumothorax or pleural fluid. Convex right scoliosis is noted.  IMPRESSION: Negative chest.   Original Report Authenticated By: Bernadene Bell. D'ALESSIO, M.D.     ASSESSMENT AND PLAN:   The patient was seen today by Dr Shirlee Latch, the patient evaluated and the data reviewed.   Chest pain, mid sternal - Pt has rec'd > 2 mg dilaudid since surgery and  currently feels light-headed and has occ palpitations, but post-op pain is controlled and chest pain is improved. She has no ST elevation although her ECG is different from and ECG 1 year ago. Her initial enzymes are negative. Will continue to cycle enzymes and recheck ECG in am. Check echocardiogram as well. If enzymes negative, can f/u as OP with stress test. If has positive enzymes, discuss with surgeons timing of cath. Add ASA 81 mg if OK with surgeons, add IV Ntg, and low-dose BB as BP will tolerate. Further eval depending on  enzymes   Active Problems:  Fibroids - s/p abd hyst, per surgeons  Anemia - receiving transfusions per surgeons. Hypokalemia - has rec'd 40 meq x 1, will order recheck for am. She was hypokalemic a year ago when she was in the ER for chest pain. Encourage potassium-rich food intake.  Signed: Theodore Demark 04/07/2012, 5:03 PM Co-Sign MD  Patient seen with PA, agree with the above note.  Patient now only has mild chest heaviness.  There was frank pain earlier today.  She is very anxious.  ECG showed anterior T wave inversions => this is different from ECG done about a year ago.  1st set cardiac enzymes negative.  Differential here will include noncardiac chest pain, ACS, and a stress (Takotsubo-type) cardiomyopathy (which can present with similar T wave inversions).   - ASA 81 if ok with surgeons, start metoprolol 12.5 mg every 6 hours and NTG gtt to start at 10 mcg/min.  - Cycle cardiac enzymes - As she still has some chest heaviness, will get an echocardiogram this evening.   - If cardiac enzymes return positive, will need to discuss with surgeons regarding use of anticoagulation/cath.    Marca Ancona 04/07/2012 5:27 PM

## 2012-04-07 NOTE — Progress Notes (Signed)
Pt. And family notified of plan to transfer to [facility], plan of care discussed

## 2012-04-08 ENCOUNTER — Encounter (HOSPITAL_COMMUNITY): Payer: Self-pay | Admitting: Surgery

## 2012-04-08 DIAGNOSIS — D649 Anemia, unspecified: Secondary | ICD-10-CM

## 2012-04-08 LAB — TYPE AND SCREEN
Antibody Screen: NEGATIVE
Unit division: 0

## 2012-04-08 LAB — BASIC METABOLIC PANEL
CO2: 25 mEq/L (ref 19–32)
Calcium: 8.6 mg/dL (ref 8.4–10.5)
Chloride: 105 mEq/L (ref 96–112)
Creatinine, Ser: 0.7 mg/dL (ref 0.50–1.10)
Glucose, Bld: 126 mg/dL — ABNORMAL HIGH (ref 70–99)
Sodium: 137 mEq/L (ref 135–145)

## 2012-04-08 LAB — CBC
HCT: 26.7 % — ABNORMAL LOW (ref 36.0–46.0)
MCH: 21.4 pg — ABNORMAL LOW (ref 26.0–34.0)
MCV: 69.5 fL — ABNORMAL LOW (ref 78.0–100.0)
Platelets: 299 10*3/uL (ref 150–400)
RBC: 3.84 MIL/uL — ABNORMAL LOW (ref 3.87–5.11)

## 2012-04-08 LAB — CK TOTAL AND CKMB (NOT AT ARMC): Relative Index: 0.4 (ref 0.0–2.5)

## 2012-04-08 LAB — TROPONIN I: Troponin I: <0.3 ng/mL (ref <0.30)

## 2012-04-08 MED ORDER — SIMETHICONE 80 MG PO CHEW
80.0000 mg | CHEWABLE_TABLET | Freq: Four times a day (QID) | ORAL | Status: DC | PRN
  Administered 2012-04-08 – 2012-04-09 (×2): 80 mg via ORAL
  Filled 2012-04-08 (×2): qty 1

## 2012-04-08 NOTE — Progress Notes (Signed)
POD # 1 s/p TAH  S: Pt notes no current CP or SOB, does note some dizziness. Pt state up out of bed, only voided once since foley out pre RN but adequate Uout. Pt notes minimal vaginal bleeding. Pain controlled w/ PCA but sleepy. NO flatus. Mild nausea, feels "gassy" no flatus  PE:  Filed Vitals:   04/08/12 1200 04/08/12 1300 04/08/12 1400 04/08/12 1530  BP: 114/71 120/71 131/89 114/69  Pulse:      Temp: 98.7 F (37.1 C)   98.6 F (37 C)  TempSrc: Oral   Oral  Resp: 25 29 21    Height:      Weight:      SpO2: 100%  100% 100%   Gen: awake, alert CV: RRR Pulm: CTAB, no crackels Abd: approp tender, ND, no tympany Inc: dressed, old staining GU: def LE: no edema, SCDs in place  CBC    Component Value Date/Time   WBC 14.6* 04/08/2012 0258   RBC 3.84* 04/08/2012 0258   HGB 8.2* 04/08/2012 0258   HCT 26.7* 04/08/2012 0258   PLT 299 04/08/2012 0258   MCV 69.5* 04/08/2012 0258   MCH 21.4* 04/08/2012 0258   MCHC 30.7 04/08/2012 0258   RDW 17.6* 04/08/2012 0258   LYMPHSABS 3.2 04/21/2011 2355   MONOABS 0.6 04/21/2011 2355   EOSABS 0.1 04/21/2011 2355   BASOSABS 0.1 04/21/2011 2355     A/P: POD #1 s/p TAH  - post-op. Will d/c PCA, move to po pain meds. Encourage ambulation, advance diet, cont SCDs while in bed, follow Uout.  - chest pain. Resolved. Cleared by cardiology - anemia. F/u CBC in am. If still dizzy, drop in hgb or orthostatic will consider tx another 1 u pRBC - Dipso. No longer needs ICU care as cleared by cards. Transfer to post-op floor. Probable d/c tomorrow  Seferino Oscar A. 04/08/2012 6:48 PM

## 2012-04-08 NOTE — Progress Notes (Signed)
TELEMETRY: Reviewed telemetry pt in NSR: Filed Vitals:   04/08/12 0615 04/08/12 0630 04/08/12 0645 04/08/12 0700  BP: 115/66 97/66 132/68 110/67  Pulse: 58 59 63 63  Temp:      TempSrc:      Resp: 17 18 22 23   Height:      Weight:      SpO2:    100%    Intake/Output Summary (Last 24 hours) at 04/08/12 0801 Last data filed at 04/08/12 0700  Gross per 24 hour  Intake 5167.04 ml  Output   3325 ml  Net 1842.04 ml    SUBJECTIVE Patient very sedated. Denies any chest pain now but apparently did have some during the night.  LABS: Basic Metabolic Panel:  Basename 04/08/12 0258 04/07/12 1259  NA 137 135  K 4.2 3.1*  CL 105 101  CO2 25 24  GLUCOSE 126* 197*  BUN 4* 5*  CREATININE 0.70 0.58  CALCIUM 8.6 8.2*  MG -- --  PHOS -- --   CBC:  Basename 04/08/12 0258 04/07/12 1259  WBC 14.6* 20.5*  NEUTROABS -- --  HGB 8.2* 7.9*  HCT 26.7* 25.7*  MCV 69.5* 68.7*  PLT 299 426*   Cardiac Enzymes:  Basename 04/08/12 0300 04/07/12 1900 04/07/12 1201  CKTOTAL 542* 338* 195*195*  CKMB 1.9 1.9 1.6  CKMBINDEX -- -- --  TROPONINI <0.30 <0.30 <0.30    Radiology/Studies:  Ct Angio Chest Pe W/cm &/or Wo Cm  04/07/2012  *RADIOLOGY REPORT*  Clinical Data: Postop.  Chest pain.  EKG changes.  Rule out pulmonary embolism.  Postop for hysterectomy.  CT ANGIOGRAPHY CHEST  Technique:  Multidetector CT imaging of the chest using the standard protocol during bolus administration of intravenous contrast. Multiplanar reconstructed images including MIPs were obtained and reviewed to evaluate the vascular anatomy.  Contrast: 65mL OMNIPAQUE IOHEXOL 350 MG/ML SOLN  Comparison: Plain film of 04/07/2012.  Findings: Lung windows demonstrate mild motion artifact inferiorly. Patchy dependent bibasilar atelectasis.  Soft tissue windows:  The quality of this exam for evaluation of pulmonary embolism is good. No evidence of pulmonary embolism.  Normal aortic caliber without dissection.  Mild  cardiomegaly, with trace bilateral pleural fluid  .No mediastinal or hilar adenopathy.  Limited abdominal imaging demonstrates minimal free intraperitoneal air, consistent with recent postoperative status.  Otherwise, no significant findings.  No acute osseous abnormality.  IMPRESSION:  1. No evidence of pulmonary embolism. 2.  Minimal motion degradation inferiorly. 3.  Mild cardiomegaly with trace bilateral pleural effusions and minimal subsegmental atelectasis at the bases. 4.  Free intraperitoneal air, consistent with the patient being postoperative day #1 for abdominal hysterectomy.   Original Report Authenticated By: Consuello Bossier, M.D.    Dg Chest Port 1 View  04/07/2012  *RADIOLOGY REPORT*  Clinical Data: Chest pain.  PORTABLE CHEST - 1 VIEW  Comparison: Plain film chest 04/21/2011.  Findings: Lungs clear.  Heart size normal.  No pneumothorax or pleural fluid. Convex right scoliosis is noted.  IMPRESSION: Negative chest.   Original Report Authenticated By: Bernadene Bell. D'ALESSIO, M.D.    Echo: Study Conclusions  - Left ventricle: The cavity size was normal. Wall thickness was normal. Systolic function was normal. The estimated ejection fraction was in the range of 55% to 60%. Although no diagnostic regional wall motion abnormality was identified, this possibility cannot be completely excluded on the basis of this study. Left ventricular diastolic function parameters were normal. - Aortic valve: There was no stenosis. - Mitral valve:  Trivial regurgitation. - Right ventricle: The cavity size was normal. Systolic function was normal. - Pulmonary arteries: PA peak pressure: 29mm Hg (S). - Inferior vena cava: The vessel was normal in size; the respirophasic diameter changes were in the normal range (= 50%); findings are consistent with normal central venous pressure. - Pericardium, extracardiac: A trivial pericardial effusion was identified posterior to the heart. Impressions:  - Normal  study.  Ecg shows NSR with mild T inversion in septal leads. No evolving changes.   PHYSICAL EXAM General: Well developed, well nourished, very sedated. Head: Normocephalic, atraumatic, sclera non-icteric, no xanthomas, nares are without discharge. Neck: Negative for carotid bruits. JVD not elevated. Lungs: Clear bilaterally to auscultation without wheezes, rales, or rhonchi. Breathing is unlabored. Heart: RRR S1 S2 without murmurs, rubs, or gallops.  Abdomen: Soft, non-tender, non-distended with normoactive bowel sounds. No hepatomegaly. No rebound/guarding. No obvious abdominal masses. Msk:  Strength and tone appears normal for age. Extremities: No clubbing, cyanosis or edema.  Distal pedal pulses are 2+ and equal bilaterally. Neuro: sedated but arousable. Moves all extremities spontaneously.   ASSESSMENT AND PLAN: 1. Chest pain. Patient has ruled out for MI. Echo is normal. Doubt cardiac etiology. Will DC IV Ntg and observe. Further plans per primary team.  2. Acute blood loss anemia post op.  3. Hypokalemia- repleted. Active Problems:  Fibroids  Anemia  Chest pain, mid sternal  Hypokalemia    Signed, Peter [place]az[place] MD,FACC 04/08/2012 8:06 AM

## 2012-04-09 LAB — CBC
Hemoglobin: 8.2 g/dL — ABNORMAL LOW (ref 12.0–15.0)
MCH: 21.8 pg — ABNORMAL LOW (ref 26.0–34.0)
MCV: 69.1 fL — ABNORMAL LOW (ref 78.0–100.0)
RBC: 3.76 MIL/uL — ABNORMAL LOW (ref 3.87–5.11)
WBC: 13.5 10*3/uL — ABNORMAL HIGH (ref 4.0–10.5)

## 2012-04-09 LAB — ABO/RH: ABO/RH(D): O NEG

## 2012-04-09 MED ORDER — OXYCODONE-ACETAMINOPHEN 5-325 MG PO TABS: 2.0000 | ORAL_TABLET | Freq: Four times a day (QID) | ORAL | Status: DC | PRN

## 2012-04-09 MED ORDER — IBUPROFEN 200 MG PO TABS: 600.0000 mg | ORAL_TABLET | Freq: Four times a day (QID) | ORAL | Status: DC | PRN

## 2012-04-09 MED ORDER — PANTOPRAZOLE SODIUM 40 MG PO TBEC: 40.0000 mg | DELAYED_RELEASE_TABLET | Freq: Every day | ORAL | Status: DC

## 2012-04-09 MED ORDER — DIPHENHYDRAMINE HCL 25 MG PO CAPS
25.0000 mg | ORAL_CAPSULE | Freq: Once | ORAL | Status: AC
  Administered 2012-04-09: 25 mg via ORAL
  Filled 2012-04-09: qty 1

## 2012-04-09 NOTE — Progress Notes (Signed)
Pt notes no chest pain, no SOB. + ambulation, + void. Tolerating po w/o N/V but pt notes decreased appetite. Pt states flatus. Pt notes Percocet relieves abdominal pain. Increased gas pains in abdomen this am. No bowel movement yet.   O: Filed Vitals:   04/08/12 1955  BP: 120/74  Pulse:   Temp: 98.2 F (36.8 C)  Resp: 16   Gen: well appearing, resting in bed CV: RRR Pulm: CTAB, no crackles Back: no CVAT Abd: soft, appropriately tender, ND, no tympany, active bowl sounds Inc: well approximated, suture closure GU: no staining LE: NT, no edema  CBC    Component Value Date/Time   WBC 13.5* 04/09/2012 0510   RBC 3.76* 04/09/2012 0510   HGB 8.2* 04/09/2012 0510   HCT 26.0* 04/09/2012 0510   PLT 311 04/09/2012 0510   MCV 69.1* 04/09/2012 0510   MCH 21.8* 04/09/2012 0510   MCHC 31.5 04/09/2012 0510   RDW 18.0* 04/09/2012 0510   LYMPHSABS 3.2 04/21/2011 2355   MONOABS 0.6 04/21/2011 2355   EOSABS 0.1 04/21/2011 2355   BASOSABS 0.1 04/21/2011 2355     A/P: POD # 2 s/p TAH for fibroids, menorrhagia, chronic anemia - anemia. Pt notes dizziness when moving around. No evidence active bleeding. Given symptoms, will transfuse 1 unit prior to d/c. Resume iron at home after starts to have bowel movements. - post-op. Recovering appropriately. Percocet, Motrin for pain. F/u in office 2 and 6 wks - chest pain. R/o MI during this visit. Will plan outpt f/u w/ cardiology. No current pain. Cardiology has signed off - Dispo- d/c home after blood today.  Erianna Jolly A. 04/09/2012 12:14 PM

## 2012-04-09 NOTE — Progress Notes (Signed)
SUBJECTIVE: The patient is doing well today.  At this time, she denies chest pain, shortness of breath, or any new concerns.  She has postoperative abdominal pain.     Marland Kitchen docusate sodium  100 mg Oral Daily  . pantoprazole  40 mg Oral Daily  . DISCONTD: aspirin EC  81 mg Oral Daily  . DISCONTD: HYDROmorphone PCA 0.3 mg/mL   Intravenous Q4H  . DISCONTD: metoprolol tartrate  12.5 mg Oral Q6H      . sodium chloride 125 mL/hr at 04/07/12 2158    OBJECTIVE: Physical Exam: Filed Vitals:   04/08/12 1300 04/08/12 1400 04/08/12 1530 04/08/12 1955  BP: 120/71 131/89 114/69 120/74  Pulse:      Temp:   98.6 F (37 C) 98.2 F (36.8 C)  TempSrc:   Oral Oral  Resp: 29 21  16   Height:      Weight:      SpO2:  100% 100% 93%    Intake/Output Summary (Last 24 hours) at 04/09/12 0909 Last data filed at 04/08/12 1800  Gross per 24 hour  Intake   1605 ml  Output    800 ml  Net    805 ml    Telemetry reveals sinus rhythm  GEN- The patient is well appearing, alert and oriented x 3 today.   Head- normocephalic, atraumatic Eyes-  Sclera clear, conjunctiva pink Ears- hearing intact Oropharynx- clear Neck- supple, no JVP Lymph- no cervical lymphadenopathy Lungs- Clear to ausculation bilaterally, normal work of breathing Heart- Regular rate and rhythm, no murmurs, rubs or gallops, PMI not laterally displaced GI- soft, appropriately ttp, +BS Extremities- no clubbing, cyanosis, or edema  LABS: Basic Metabolic Panel:  Basename 04/08/12 0258 04/07/12 1259  NA 137 135  K 4.2 3.1*  CL 105 101  CO2 25 24  GLUCOSE 126* 197*  BUN 4* 5*  CREATININE 0.70 0.58  CALCIUM 8.6 8.2*  MG -- --  PHOS -- --   Liver Function Tests: No results found for this basename: AST:2,ALT:2,ALKPHOS:2,BILITOT:2,PROT:2,ALBUMIN:2 in the last 72 hours No results found for this basename: LIPASE:2,AMYLASE:2 in the last 72 hours CBC:  Basename 04/09/12 0510 04/08/12 0258  WBC 13.5* 14.6*  NEUTROABS -- --    HGB 8.2* 8.2*  HCT 26.0* 26.7*  MCV 69.1* 69.5*  PLT 311 299   Cardiac Enzymes:  Basename 04/08/12 0300 04/07/12 1900 04/07/12 1201  CKTOTAL 542* 338* 195*195*  CKMB 1.9 1.9 1.6  CKMBINDEX -- -- --  TROPONINI <0.30 <0.30 <0.30   Ct Angio Chest Pe W/cm &/or Wo Cm  04/07/2012  *RADIOLOGY REPORT*  Clinical Data: Postop.  Chest pain.  EKG changes.  Rule out pulmonary embolism.  Postop for hysterectomy.  CT ANGIOGRAPHY CHEST  Technique:  Multidetector CT imaging of the chest using the standard protocol during bolus administration of intravenous contrast. Multiplanar reconstructed images including MIPs were obtained and reviewed to evaluate the vascular anatomy.  Contrast: 65mL OMNIPAQUE IOHEXOL 350 MG/ML SOLN  Comparison: Plain film of 04/07/2012.  Findings: Lung windows demonstrate mild motion artifact inferiorly. Patchy dependent bibasilar atelectasis.  Soft tissue windows:  The quality of this exam for evaluation of pulmonary embolism is good. No evidence of pulmonary embolism.  Normal aortic caliber without dissection.  Mild cardiomegaly, with trace bilateral pleural fluid  .No mediastinal or hilar adenopathy.  Limited abdominal imaging demonstrates minimal free intraperitoneal air, consistent with recent postoperative status.  Otherwise, no significant findings.  No acute osseous abnormality.  IMPRESSION:  1. No evidence  of pulmonary embolism. 2.  Minimal motion degradation inferiorly. 3.  Mild cardiomegaly with trace bilateral pleural effusions and minimal subsegmental atelectasis at the bases. 4.  Free intraperitoneal air, consistent with the patient being postoperative day #1 for abdominal hysterectomy.   Original Report Authenticated By: Consuello Bossier, M.D.    Echo reviewed   ASSESSMENT AND PLAN:  1. Chest pain. Patient has ruled out for MI. Echo is normal. Doubt cardiac etiology.  No further inpatient CV workup planned Will arrange outpatient lexiscan myoview when recovered from  surgery (?2-3 weeks)  2. Acute blood loss anemia post op.  Per primary team   No further inpatient CV workup Will sign off  Please call with questions.   Hillis Range, MD 04/09/2012 9:09 AM

## 2012-04-10 LAB — TYPE AND SCREEN: ABO/RH(D): O NEG

## 2012-04-12 ENCOUNTER — Encounter (HOSPITAL_COMMUNITY): Payer: Self-pay | Admitting: Registered Nurse

## 2012-04-12 NOTE — Discharge Summary (Signed)
Chief complaint: Fibroid uterus  History of present illness: This is a 46 year old patient with no significant medical history who presented for total abdominal hysterectomy with ovarian retention. Patient had a history of menorrhagia, fibroids, chronic anemia. Patient had been on iron preoperatively.  See the scanned H&P for full history and physical information. In short a stable 46 year old woman with large fibroid no other abnormal exam findings for abdominal hysterectomy  Hospital course: Given the limited uterine mobility and the large uterine size, patient was consented for a total double hysterectomy. This proceeded in an uneventful fashion with removal of the uterus cervix and bilateral tubes. Ovaries were left in situ. 400 cc EBL was noted intraoperatively. No apparent complications during the surgery  In the PACU patient noted chest pain. Anesthesia was called to evaluate this chest pain and an EKG was done. EKG changes were noted from her prior EKG approximately 2 years ago. Patient was given IV pain medicine and sent to the ICU for recovery on telemetry. A CBC returned with a hemoglobin of 8.2 and decision was made to transfuse 1 unit of packed red blood cells.  Critical care and cardiology was then consulted given the patient's chest pain. They began the patient on aspirin, continue the dialogue and PCA and started the patient on a nitroglycerin drip. A chest x-ray was done which was normal.  Patient noted some mild dizziness, minimal abdominal pain, no vaginal bleeding, abdominal gas pains but continued chest pain. At this point cardiology recommended transfer to [facility]: To the heart care unit. Given the risk for DVT and PE given the patient's complaint of chest pain, the lack of anticoagulation preop, the large fibroid uterus which has the potential for creating venous stasis, the patient underwent a CT indigo and had no evidence of pulmonary embolus.  The patient remained in the cardiac  ICU overnight. While consideration was done for an Angiocath this was not done given the patient's improvement in chest pain.  On postoperative day #1 the patient was improving, no chest pain, abdominal pain controlled with IV medicines. Patient was tolerating a regular diet, out of bed, and Foley was removed. Given cardiac clearance the patient was written for discharge from the ICU. Patient was started on oral pain medicines and the PCA was discontinued.   On postoperative day #2 patient's hemoglobin remained stable although the patient continued to complain of dizziness with activity. Patient requested a second unit of packed red blood cells. This was granted given her hemoglobin stable in the eights. Orthostatic vital signs were negative. Patient had a 1 unit packed red cell transfusion and decision was made to discharge to home.   Discharge diagnosis: Fibroids, menorrhagia, symptomatic anemia, status post total abdominal hysterectomy, chest pain.  Discharge disposition: To home  Discharge condition: Stable  Discharge medications: Ibuprofen as needed, protonix, Percocet as needed  Followup: GYN postop followup in 2 weeks, cardiac followup in 2 weeks  Gracy Ehly A. 04/12/2012 2:32 PM

## 2012-05-25 ENCOUNTER — Telehealth: Payer: Self-pay | Admitting: Cardiovascular Disease

## 2012-05-25 NOTE — Telephone Encounter (Signed)
error 

## 2012-05-31 ENCOUNTER — Encounter (HOSPITAL_COMMUNITY): Payer: 59

## 2012-06-01 ENCOUNTER — Telehealth (HOSPITAL_COMMUNITY): Payer: Self-pay

## 2012-06-01 NOTE — Telephone Encounter (Signed)
Kelly Hartman was a no show for [employer] on 05-31-12. Left message on patient's recorder to call us back on 05-31-12 and 06-01-12.Notified Dr. Jenel Lucks nurse of no show. Irean Hong, RN

## 2012-10-19 IMAGING — CR DG CHEST 1V PORT
1 series · 1 of 1 positions shown · non-contrast
Comparison: None.

CLINICAL DATA: Chest pain and short of breath

PORTABLE CHEST - 1 VIEW

[AP]
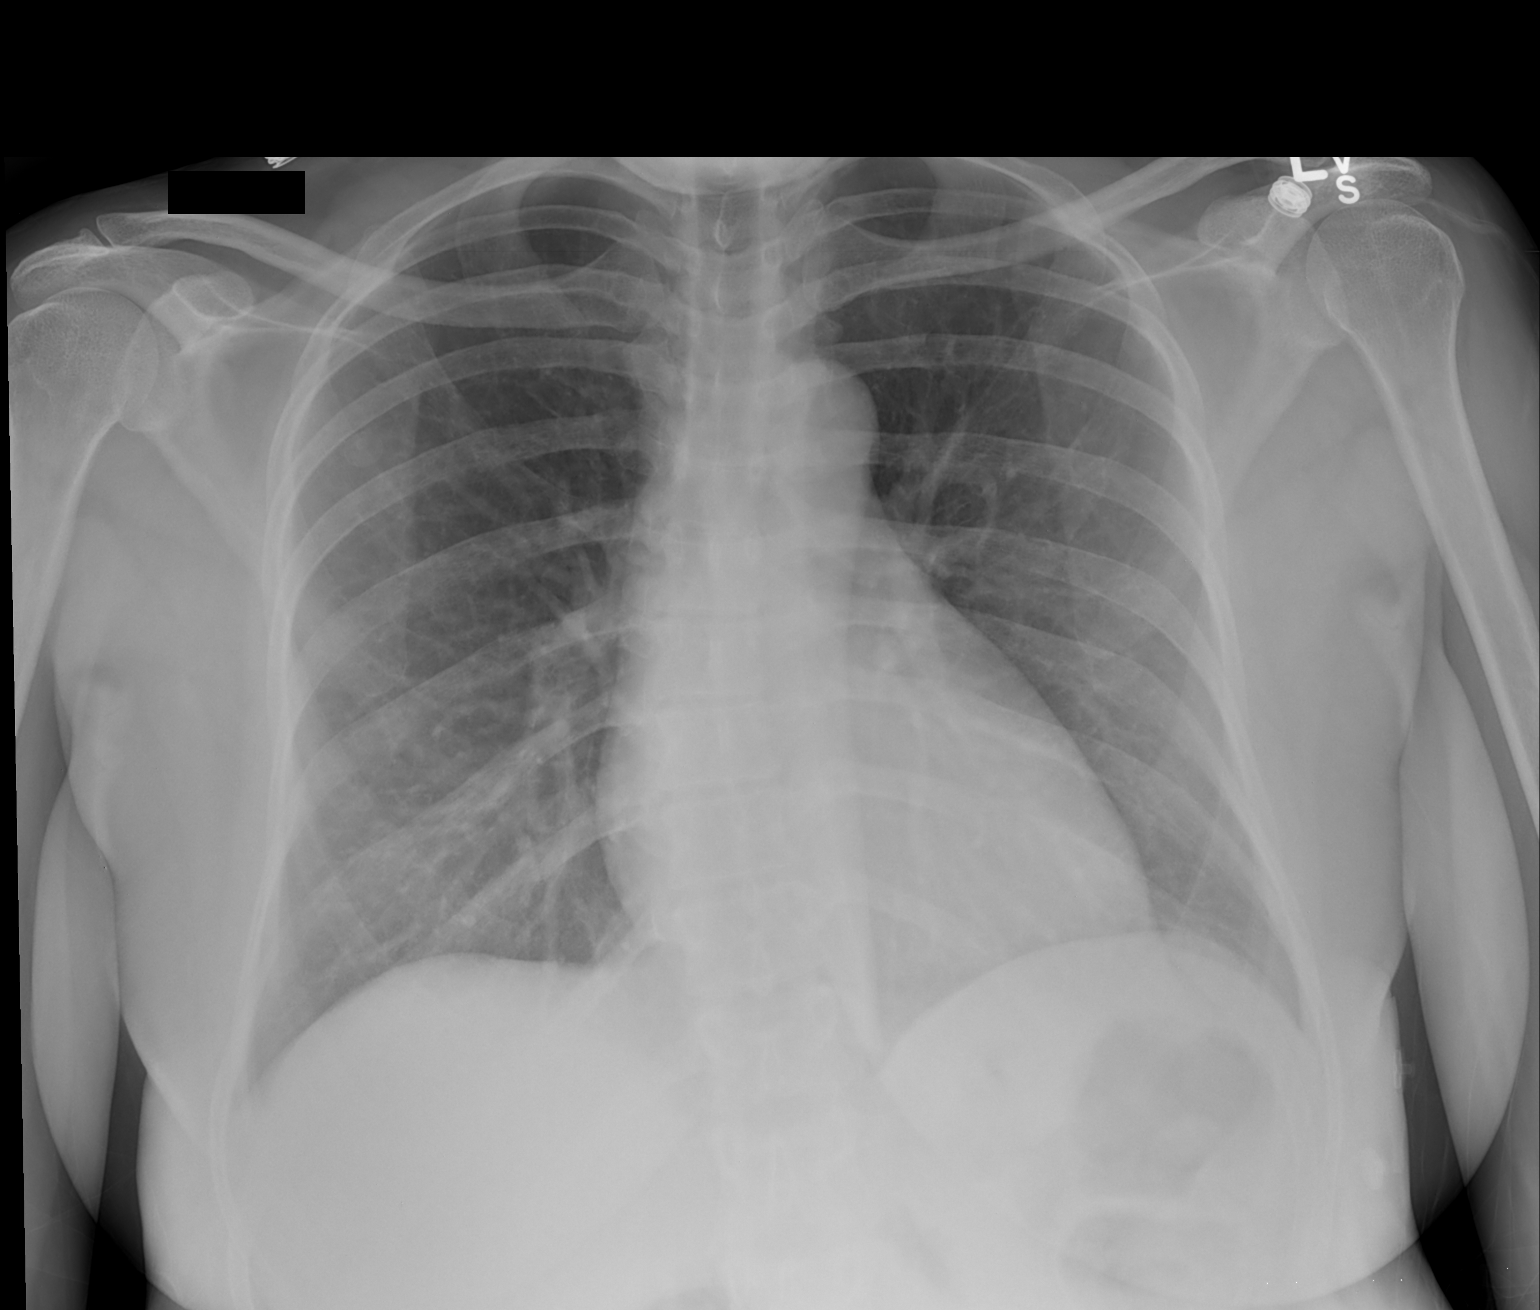

[1 of 1 positions shown; findings below may reference images not displayed]

FINDINGS: Heart size within normal limits.  Negative for heart
failure.  The lungs are clear without infiltrate or effusion.
Negative for mass lesion.
IMPRESSION: Negative

## 2013-06-13 ENCOUNTER — Emergency Department (HOSPITAL_COMMUNITY)
Admission: EM | Admit: 2013-06-13 | Discharge: 2013-06-13 | Disposition: A | Discharge status: Home / Self Care | Payer: 59 | Attending: Emergency Medicine | Admitting: Emergency Medicine

## 2013-06-13 ENCOUNTER — Encounter (HOSPITAL_COMMUNITY): Payer: Self-pay | Admitting: Emergency Medicine

## 2013-06-13 ENCOUNTER — Emergency Department (HOSPITAL_COMMUNITY): Payer: 59

## 2013-06-13 DIAGNOSIS — R5381 Other malaise: Secondary | ICD-10-CM | POA: Insufficient documentation

## 2013-06-13 DIAGNOSIS — R05 Cough: Secondary | ICD-10-CM | POA: Insufficient documentation

## 2013-06-13 DIAGNOSIS — Z9189 Other specified personal risk factors, not elsewhere classified: Secondary | ICD-10-CM | POA: Insufficient documentation

## 2013-06-13 DIAGNOSIS — Z8742 Personal history of other diseases of the female genital tract: Secondary | ICD-10-CM | POA: Insufficient documentation

## 2013-06-13 DIAGNOSIS — R0602 Shortness of breath: Secondary | ICD-10-CM | POA: Insufficient documentation

## 2013-06-13 DIAGNOSIS — R42 Dizziness and giddiness: Secondary | ICD-10-CM | POA: Insufficient documentation

## 2013-06-13 DIAGNOSIS — R059 Cough, unspecified: Secondary | ICD-10-CM | POA: Insufficient documentation

## 2013-06-13 DIAGNOSIS — R350 Frequency of micturition: Secondary | ICD-10-CM | POA: Insufficient documentation

## 2013-06-13 DIAGNOSIS — Z888 Allergy status to other drugs, medicaments and biological substances status: Secondary | ICD-10-CM | POA: Insufficient documentation

## 2013-06-13 DIAGNOSIS — M4120 Other idiopathic scoliosis, site unspecified: Secondary | ICD-10-CM | POA: Insufficient documentation

## 2013-06-13 DIAGNOSIS — D649 Anemia, unspecified: Secondary | ICD-10-CM | POA: Insufficient documentation

## 2013-06-13 LAB — POCT I-STAT, CHEM 8
BUN: 8 mg/dL (ref 6–23)
Calcium, Ion: 1.24 mmol/L — ABNORMAL HIGH (ref 1.12–1.23)
Chloride: 102 mEq/L (ref 96–112)
Creatinine, Ser: 0.8 mg/dL (ref 0.50–1.10)
Glucose, Bld: 99 mg/dL (ref 70–99)
TCO2: 27 mmol/L (ref 0–100)

## 2013-06-13 LAB — CBC WITH DIFFERENTIAL/PLATELET
Basophils Absolute: 0 10*3/uL (ref 0.0–0.1)
Eosinophils Relative: 2 % (ref 0–5)
HCT: 38.1 % (ref 36.0–46.0)
Hemoglobin: 13.1 g/dL (ref 12.0–15.0)
Lymphocytes Relative: 46 % (ref 12–46)
MCV: 88.2 fL (ref 78.0–100.0)
Monocytes Absolute: 0.4 10*3/uL (ref 0.1–1.0)
Monocytes Relative: 6 % (ref 3–12)
Neutro Abs: 3.1 10*3/uL (ref 1.7–7.7)
RDW: 12.8 % (ref 11.5–15.5)
WBC: 6.9 10*3/uL (ref 4.0–10.5)

## 2013-06-13 LAB — URINALYSIS, ROUTINE W REFLEX MICROSCOPIC
Bilirubin Urine: NEGATIVE
Glucose, UA: NEGATIVE mg/dL
Hgb urine dipstick: NEGATIVE
Specific Gravity, Urine: 1.015 (ref 1.005–1.030)
Urobilinogen, UA: 0.2 mg/dL (ref 0.0–1.0)
pH: 7 (ref 5.0–8.0)

## 2013-06-13 LAB — POCT I-STAT TROPONIN I: Troponin i, poc: 0 ng/mL (ref 0.00–0.08)

## 2013-06-13 NOTE — ED Notes (Signed)
Report given to Paul RN.

## 2013-06-13 NOTE — ED Notes (Signed)
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

## 2013-06-13 NOTE — ED Provider Notes (Signed)
CSN: [id number]     Arrival date & time 06/13/13  1439 History   First MD Initiated Contact with Patient 06/13/13 1649     Chief Complaint  Patient presents with  . Anxiety  . Shortness of Breath   (Consider location/radiation/quality/duration/timing/severity/associated sxs/prior Treatment) The history is provided by the patient.   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 She states she thinks the fumes got her in she felt lightheaded and felt like she cannot pass out. She states she's felt a little dizzy last few days. No chest pain. No Palpitations. She's had a little cough. No numbness weakness. No headache. No confusion. She states she's had some mild fatigue.  Past Medical History  Diagnosis Date  . Anemia   . Shortness of breath     with exertion-d/t anemia  . History of blood transfusion 2010, 2011    [facility]  . Fibroids    Past Surgical History  Procedure Laterality Date  . Cesarean section  x2  . Abdominal hysterectomy  04/07/2012    Procedure: HYSTERECTOMY ABDOMINAL;  Surgeon: Tresa Endo A. Ernestina Penna, MD;  Location: [facility];  Service: Gynecology;  Laterality: N/A;  Requests 2 hrs.   History reviewed. No pertinent family history. History  Substance Use Topics  . Smoking status: Never Smoker   . Smokeless tobacco: Not on file  . Alcohol Use: No   OB History   Grav Para Term Preterm Abortions TAB SAB Ect Mult Living                 Review of Systems  Constitutional: Positive for fatigue. Negative for activity change and appetite change.  Eyes: Negative for pain.  Respiratory: Positive for cough and shortness of breath. Negative for chest tightness.   Cardiovascular: Negative for chest pain and leg swelling.  Gastrointestinal: Negative for nausea, vomiting, abdominal pain and diarrhea.  Genitourinary: Positive for frequency. Negative for flank pain.  Musculoskeletal: Negative for back pain and neck  stiffness.  Skin: Negative for rash.  Neurological: Positive for light-headedness. Negative for weakness, numbness and headaches.  Psychiatric/Behavioral: Negative for behavioral problems.    Allergies  Aspirin; Cyclobenzaprine; and Fentanyl  Home Medications  No current outpatient prescriptions on file. BP 111/76  Pulse 64  Temp(Src) 98 F (36.7 C) (Oral)  Resp 18  SpO2 100% Physical Exam  Nursing note and vitals reviewed. Constitutional: She is oriented to person, place, and time. She appears well-developed and well-nourished.  HENT:  Head: Normocephalic and atraumatic.  Eyes: EOM are normal. Pupils are equal, round, and reactive to light.  Neck: Normal range of motion. Neck supple.  Cardiovascular: Normal rate, regular rhythm and normal heart sounds.   No murmur heard. Pulmonary/Chest: Effort normal and breath sounds normal. No respiratory distress. She has no wheezes. She has no rales.  Abdominal: Soft. Bowel sounds are normal. She exhibits no distension. There is no tenderness. There is no rebound and no guarding.  Musculoskeletal: Normal range of motion.  Neurological: She is alert and oriented to person, place, and time. No cranial nerve deficit.  Skin: Skin is warm and dry.  Psychiatric: She has a normal mood and affect. Her speech is normal.    ED Course  Procedures (including critical care time) Labs Review Labs Reviewed  POCT I-STAT, CHEM 8 - Abnormal; Notable for the following:    Calcium, Ion 1.24 (*)    All other components within normal limits  CBC WITH DIFFERENTIAL  URINALYSIS, ROUTINE W REFLEX MICROSCOPIC  POCT I-STAT TROPONIN I   Imaging Review Dg Chest 2 View (if Patient Has Fever And/or Copd)  06/13/2013   CLINICAL DATA:  Shortness of breath  EXAM: CHEST  2 VIEW  COMPARISON:  Chest radiograph and chest CT April 07, 2012  FINDINGS: There is no edema or consolidation. Heart size and pulmonary vascularity are normal. No adenopathy. No bone lesions.  There is mid thoracic dextroscoliosis.  IMPRESSION: Scoliosis.  No edema or consolidation.   Electronically Signed   By: Bretta Bang M.D.   On: 06/13/2013 16:15    EKG Interpretation    Date/Time:  Tuesday June 13 2013 18:09:21 EST Ventricular Rate:  58 PR Interval:  160 QRS Duration: 94 QT Interval:  418 QTC Calculation: 410 R Axis:   -23 Text Interpretation:  Sinus rhythm Borderline left axis deviation Probable anterior infarct, age indeterminate No significant change since last tracing Confirmed by Trayce Maino  MD, Donnald Tabar ([id number]) on 06/13/2013 8:23:12 PM            MDM   1. Lightheadedness    Patient with lightheadedness. Labs and EKG reassruing. Will d/c    Juliet Rude. Rubin Payor, MD 06/14/13 [phone]

## 2013-10-06 IMAGING — CR DG CHEST 1V PORT
1 series · 1 of 1 positions shown · non-contrast
Comparison: Plain film chest 04/21/2011.

CLINICAL DATA: Chest pain.

PORTABLE CHEST - 1 VIEW

[view not recorded]
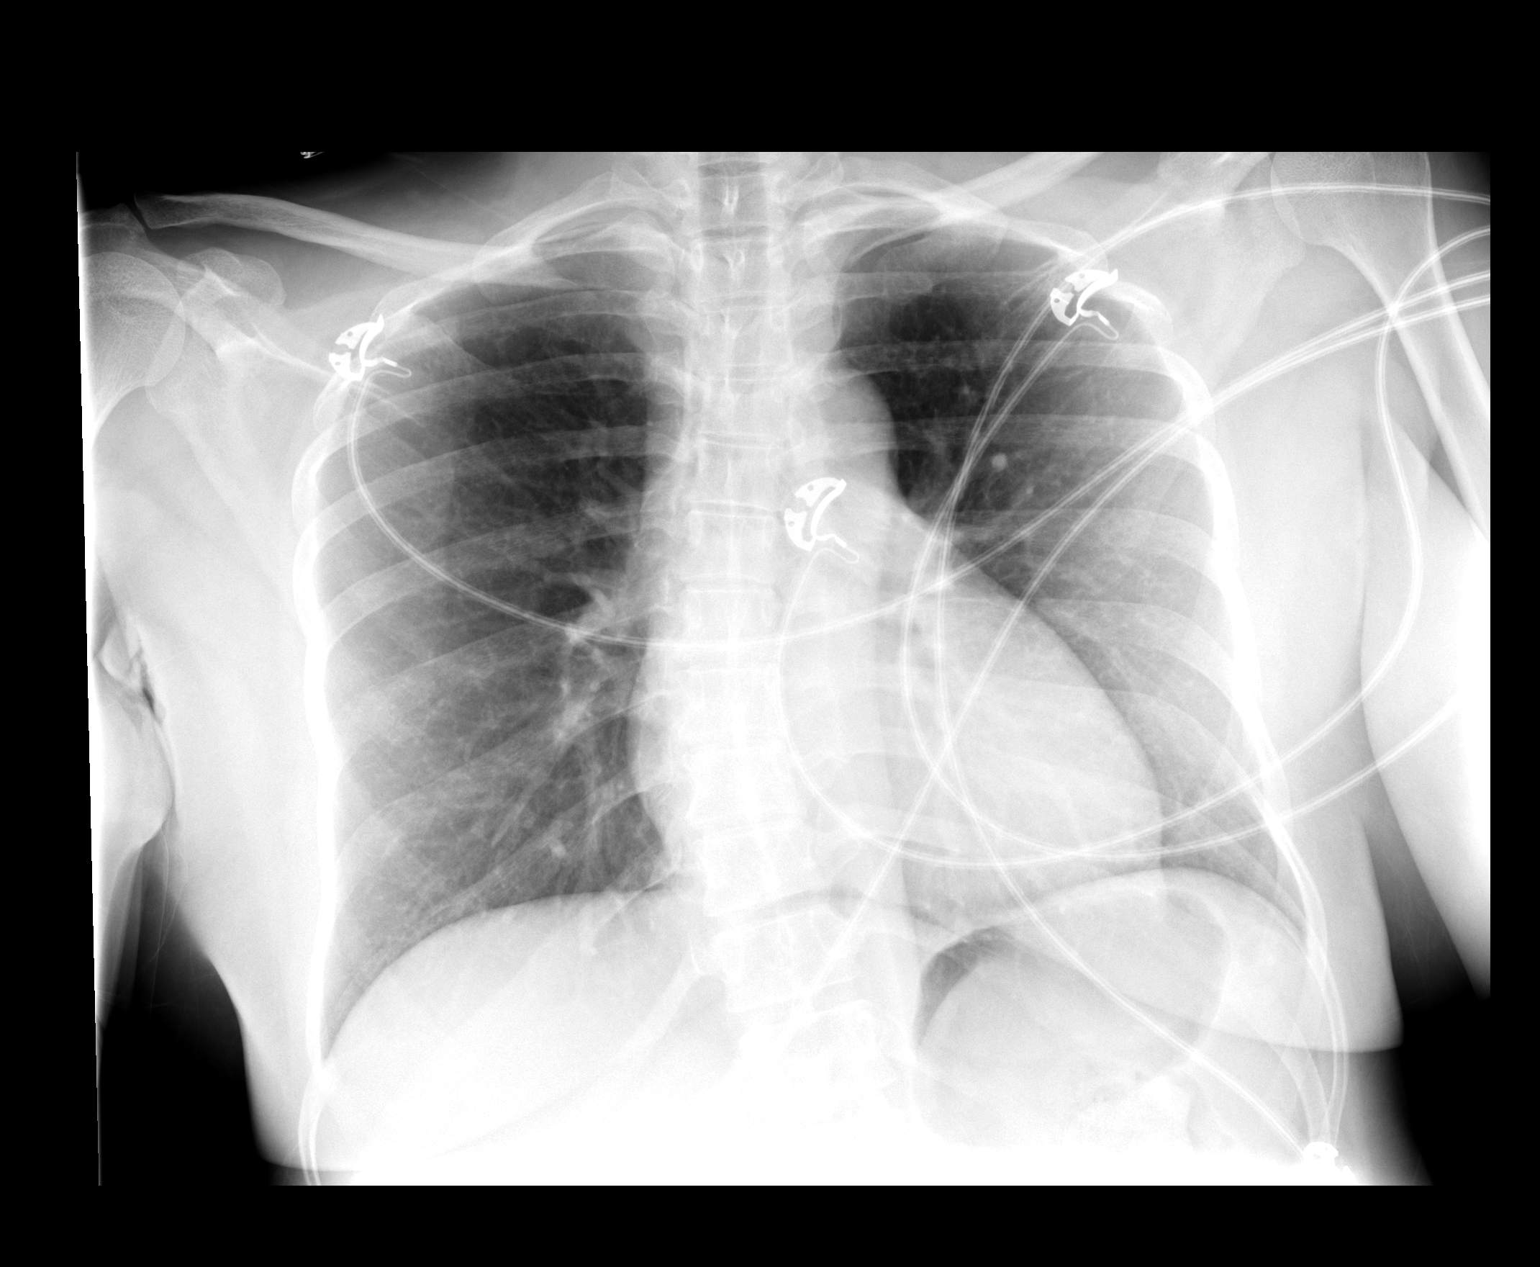

[1 of 1 positions shown; findings below may reference images not displayed]

FINDINGS: Lungs clear.  Heart size normal.  No pneumothorax or
pleural fluid. Convex right scoliosis is noted.
IMPRESSION: Negative chest.

## 2014-10-09 ENCOUNTER — Other Ambulatory Visit: Payer: Self-pay | Admitting: Internal Medicine

## 2014-10-09 DIAGNOSIS — Z1231 Encounter for screening mammogram for malignant neoplasm of breast: Secondary | ICD-10-CM

## 2014-10-17 ENCOUNTER — Ambulatory Visit: Payer: Self-pay

## 2014-10-22 ENCOUNTER — Ambulatory Visit: Payer: Self-pay

## 2015-01-11 ENCOUNTER — Ambulatory Visit
Admission: RE | Admit: 2015-01-11 | Discharge: 2015-01-11 | Disposition: A | Discharge status: Home / Self Care | Payer: Managed Care, Other (non HMO) | Source: Ambulatory Visit | Attending: Internal Medicine | Admitting: Internal Medicine

## 2015-01-11 DIAGNOSIS — Z1231 Encounter for screening mammogram for malignant neoplasm of breast: Secondary | ICD-10-CM

## 2016-06-24 ENCOUNTER — Emergency Department (HOSPITAL_COMMUNITY)
Admission: EM | Admit: 2016-06-24 | Discharge: 2016-06-25 | Disposition: A | Discharge status: Home / Self Care | Payer: BLUE CROSS/BLUE SHIELD | Attending: Emergency Medicine | Admitting: Emergency Medicine

## 2016-06-24 ENCOUNTER — Emergency Department (HOSPITAL_COMMUNITY): Payer: BLUE CROSS/BLUE SHIELD

## 2016-06-24 ENCOUNTER — Encounter (HOSPITAL_COMMUNITY): Payer: Self-pay | Admitting: Emergency Medicine

## 2016-06-24 DIAGNOSIS — Y92410 Unspecified street and highway as the place of occurrence of the external cause: Secondary | ICD-10-CM | POA: Insufficient documentation

## 2016-06-24 DIAGNOSIS — Z79899 Other long term (current) drug therapy: Secondary | ICD-10-CM | POA: Insufficient documentation

## 2016-06-24 DIAGNOSIS — M546 Pain in thoracic spine: Secondary | ICD-10-CM | POA: Diagnosis not present

## 2016-06-24 DIAGNOSIS — Y939 Activity, unspecified: Secondary | ICD-10-CM | POA: Insufficient documentation

## 2016-06-24 DIAGNOSIS — Y999 Unspecified external cause status: Secondary | ICD-10-CM | POA: Diagnosis not present

## 2016-06-24 DIAGNOSIS — M542 Cervicalgia: Secondary | ICD-10-CM | POA: Diagnosis not present

## 2016-06-24 DIAGNOSIS — M7918 Myalgia, other site: Secondary | ICD-10-CM

## 2016-06-24 MED ORDER — KETOROLAC TROMETHAMINE 15 MG/ML IJ SOLN: 15.0000 mg | Freq: Once | INTRAMUSCULAR | Status: DC

## 2016-06-24 MED ORDER — KETOROLAC TROMETHAMINE 60 MG/2ML IM SOLN
60.0000 mg | Freq: Once | INTRAMUSCULAR | Status: DC
  Filled 2016-06-24: qty 2
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
## 2016-06-24 NOTE — ED Notes (Signed)
Patient transported to X-ray 
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
## 2016-06-25 DIAGNOSIS — M542 Cervicalgia: Secondary | ICD-10-CM | POA: Diagnosis not present

## 2016-06-25 MED ORDER — NAPROXEN 500 MG PO TABS
500.0000 mg | ORAL_TABLET | Freq: Once | ORAL | Status: AC
  Administered 2016-06-25: 500 mg via ORAL
  Filled 2016-06-25: qty 1

## 2016-06-25 MED ORDER — NAPROXEN 500 MG PO TABS: 500.0000 mg | ORAL_TABLET | Freq: Two times a day (BID) | ORAL | 0 refills | Status: DC

## 2016-06-25 MED ORDER — DIAZEPAM 5 MG PO TABS: 5.0000 mg | ORAL_TABLET | Freq: Four times a day (QID) | ORAL | 0 refills | Status: DC | PRN

## 2016-06-25 NOTE — ED Notes (Signed)
Patient d/c'd self care.  F/U and medications reviewed.  Patient verbalized understanding. 

## 2018-01-03 ENCOUNTER — Other Ambulatory Visit: Payer: Self-pay | Admitting: Internal Medicine

## 2018-01-03 DIAGNOSIS — Z1231 Encounter for screening mammogram for malignant neoplasm of breast: Secondary | ICD-10-CM

## 2018-05-06 ENCOUNTER — Ambulatory Visit (INDEPENDENT_AMBULATORY_CARE_PROVIDER_SITE_OTHER): Payer: Self-pay | Admitting: Family Medicine

## 2018-05-06 VITALS — BP 138/90 | HR 71 | Temp 98.7°F | Resp 18 | Wt 138.6 lb

## 2018-05-06 DIAGNOSIS — R079 Chest pain, unspecified: Secondary | ICD-10-CM

## 2018-05-06 DIAGNOSIS — I10 Essential (primary) hypertension: Secondary | ICD-10-CM

## 2018-05-06 NOTE — Progress Notes (Signed)
Kelly Hartman is a 52 y.o. female who presents today with concerns of elevated blood pressure and chest pain. She reports in the last few weeks noticing that her blood pressure reading in the evening was elevated about 150/100's. She is diagnosed with primary hypertension about 5 years ago and has been managed on her current medication for the last 4 years without any problems.  [name] is between PCP's as she changed insc. Plans and the new PCP she switched to covered under the new plan reached their max patinet cap and is no long accepting new patients. [name] did not know who to ask about her BP elevations so she began to double her medication dose and take 2 tablets of her BP medication per day- a dose in the morning and one in the evening. She reports that even when making this change that the am blood pressure was not nmuch changed by the additional dose. She reports that she never took more than 2 doses in 1 day. Recently she reports headaches, dizziness, chest pain and left arm tingling and became concerned. She walked in today to have her BP evaluated, she reports that the chest pain and left arm tingling began last night..  Review of Systems  Constitutional: Negative for chills, fever and malaise/fatigue.  HENT: Negative for congestion, ear discharge, ear pain, sinus pain and sore throat.   Eyes: Negative.   Respiratory: Negative for cough, sputum production and shortness of breath.   Cardiovascular: Positive for chest pain.       Reports chest tightness and left arm weakness  Gastrointestinal: Negative for abdominal pain, diarrhea, nausea and vomiting.  Genitourinary: Negative for dysuria, frequency, hematuria and urgency.  Musculoskeletal: Negative for myalgias.  Skin: Negative.   Neurological: Negative for headaches.  Endo/Heme/Allergies: Negative.   Psychiatric/Behavioral: Negative.     O: Vitals:   05/06/18 1614  BP: 138/90  Pulse: 71  Resp: 18  Temp: 98.7 F (37.1  C)  SpO2: 96%     Physical Exam  Constitutional: She is oriented to person, place, and time. Vital signs are normal. She appears well-developed and well-nourished. She is active.  Non-toxic appearance. She does not have a sickly appearance.  HENT:  Head: Normocephalic.  Right Ear: Hearing, tympanic membrane, external ear and ear canal normal.  Left Ear: Hearing, tympanic membrane, external ear and ear canal normal.  Nose: Nose normal.  Mouth/Throat: Uvula is midline and oropharynx is clear and moist.  Neck: Normal range of motion. Neck supple.  Cardiovascular: Normal rate, regular rhythm, normal heart sounds and normal pulses.  Pulmonary/Chest: Effort normal and breath sounds normal.  Abdominal: Soft. Bowel sounds are normal.  Musculoskeletal: Normal range of motion.  Lymphadenopathy:       Head (right side): No submental and no submandibular adenopathy present.       Head (left side): No submental and no submandibular adenopathy present.    She has no cervical adenopathy.  Neurological: She is alert and oriented to person, place, and time. She has normal strength. No cranial nerve deficit or sensory deficit.  Full Neuro exam WNL and strength and symmetry WNL  Psychiatric: She has a normal mood and affect.  Vitals reviewed.  A: 1. Essential hypertension    P: PATIENT ADVISED TO SEEK CARE IN URGENT CARE OR EMERGENCY DEPARTMENT TO HAVE HER CHEST PAIN EVALUATED. SHE AGREED TO SEEK CARE NOW.  Physical exam at this time is unremarkable- patient is otherwise asymptomatic outside of chest pain/left arm tingling.  Discussed exam findings, diagnosis etiology and medication use and indications reviewed with patient. Follow- Up and discharge instructions provided.Patient verbalized understanding of information provided and agrees with plan of care (POC), all questions answered.  1. Essential hypertension Advised to take medication as originally prescribed and to reach out to PCP for  guidance on what steps to take for perceived elevated blood pressure reading versus taking extra medication. - enalapril-hydrochlorothiazide (VASERETIC) 10-25 MG tablet; Take 1 tablet by mouth daily.

## 2018-05-06 NOTE — Patient Instructions (Addendum)
PLAN> Follow up in ED for chest pain and left arm tingling Seek care with PCP for evaluation of reported BP elevations  Hypertension Hypertension, commonly called high blood pressure, is when the force of blood pumping through the arteries is too strong. The arteries are the blood vessels that carry blood from the heart throughout the body. Hypertension forces the heart to work harder to pump blood and may cause arteries to become narrow or stiff. Having untreated or uncontrolled hypertension can cause heart attacks, strokes, kidney disease, and other problems. A blood pressure reading consists of a higher number over a lower number. Ideally, your blood pressure should be below 120/80. The first ("top") number is called the systolic pressure. It is a measure of the pressure in your arteries as your heart beats. The second ("bottom") number is called the diastolic pressure. It is a measure of the pressure in your arteries as the heart relaxes. What are the causes? The cause of this condition is not known. What increases the risk? Some risk factors for high blood pressure are under your control. Others are not. Factors you can change  Smoking.  Having type 2 diabetes mellitus, high cholesterol, or both.  Not getting enough exercise or physical activity.  Being overweight.  Having too much fat, sugar, calories, or salt (sodium) in your diet.  Drinking too much alcohol. Factors that are difficult or impossible to change  Having chronic kidney disease.  Having a family history of high blood pressure.  Age. Risk increases with age.  Race. You may be at higher risk if you are African-American.  Gender. Men are at higher risk than women before age 79. After age 67, women are at higher risk than men.  Having obstructive sleep apnea.  Stress. What are the signs or symptoms? Extremely high blood pressure (hypertensive crisis) may cause:  Headache.  Anxiety.  Shortness of  breath.  Nosebleed.  Nausea and vomiting.  Severe chest pain.  Jerky movements you cannot control (seizures).  How is this diagnosed? This condition is diagnosed by measuring your blood pressure while you are seated, with your arm resting on a surface. The cuff of the blood pressure monitor will be placed directly against the skin of your upper arm at the level of your heart. It should be measured at least twice using the same arm. Certain conditions can cause a difference in blood pressure between your right and left arms. Certain factors can cause blood pressure readings to be lower or higher than normal (elevated) for a short period of time:  When your blood pressure is higher when you are in a health care provider's office than when you are at home, this is called white coat hypertension. Most people with this condition do not need medicines.  When your blood pressure is higher at home than when you are in a health care provider's office, this is called masked hypertension. Most people with this condition may need medicines to control blood pressure.  If you have a high blood pressure reading during one visit or you have normal blood pressure with other risk factors:  You may be asked to return on a different day to have your blood pressure checked again.  You may be asked to monitor your blood pressure at home for 1 week or longer.  If you are diagnosed with hypertension, you may have other blood or imaging tests to help your health care provider understand your overall risk for other conditions. How is this  treated? This condition is treated by making healthy lifestyle changes, such as eating healthy foods, exercising more, and reducing your alcohol intake. Your health care provider may prescribe medicine if lifestyle changes are not enough to get your blood pressure under control, and if:  Your systolic blood pressure is above 130.  Your diastolic blood pressure is above  80.  Your personal target blood pressure may vary depending on your medical conditions, your age, and other factors. Follow these instructions at home: Eating and drinking  Eat a diet that is high in fiber and potassium, and low in sodium, added sugar, and fat. An example eating plan is called the DASH (Dietary Approaches to Stop Hypertension) diet. To eat this way: ? Eat plenty of fresh fruits and vegetables. Try to fill half of your plate at each meal with fruits and vegetables. ? Eat whole grains, such as whole wheat pasta, brown rice, or whole grain bread. Fill about one quarter of your plate with whole grains. ? Eat or drink low-fat dairy products, such as skim milk or low-fat yogurt. ? Avoid fatty cuts of meat, processed or cured meats, and poultry with skin. Fill about one quarter of your plate with lean proteins, such as fish, chicken without skin, beans, eggs, and tofu. ? Avoid premade and processed foods. These tend to be higher in sodium, added sugar, and fat.  Reduce your daily sodium intake. Most people with hypertension should eat less than 1,500 mg of sodium a day.  Limit alcohol intake to no more than 1 drink a day for nonpregnant women and 2 drinks a day for men. One drink equals 12 oz of beer, 5 oz of wine, or 1 oz of hard liquor. Lifestyle  Work with your health care provider to maintain a healthy body weight or to lose weight. Ask what an ideal weight is for you.  Get at least 30 minutes of exercise that causes your heart to beat faster (aerobic exercise) most days of the week. Activities may include walking, swimming, or biking.  Include exercise to strengthen your muscles (resistance exercise), such as pilates or lifting weights, as part of your weekly exercise routine. Try to do these types of exercises for 30 minutes at least 3 days a week.  Do not use any products that contain nicotine or tobacco, such as cigarettes and e-cigarettes. If you need help quitting, ask  your health care provider.  Monitor your blood pressure at home as told by your health care provider.  Keep all follow-up visits as told by your health care provider. This is important. Medicines  Take over-the-counter and prescription medicines only as told by your health care provider. Follow directions carefully. Blood pressure medicines must be taken as prescribed.  Do not skip doses of blood pressure medicine. Doing this puts you at risk for problems and can make the medicine less effective.  Ask your health care provider about side effects or reactions to medicines that you should watch for. Contact a health care provider if:  You think you are having a reaction to a medicine you are taking.  You have headaches that keep coming back (recurring).  You feel dizzy.  You have swelling in your ankles.  You have trouble with your vision. Get help right away if:  You develop a severe headache or confusion.  You have unusual weakness or numbness.  You feel faint.  You have severe pain in your chest or abdomen.  You vomit repeatedly.  You have  trouble breathing. Summary  Hypertension is when the force of blood pumping through your arteries is too strong. If this condition is not controlled, it may put you at risk for serious complications.  Your personal target blood pressure may vary depending on your medical conditions, your age, and other factors. For most people, a normal blood pressure is less than 120/80.  Hypertension is treated with lifestyle changes, medicines, or a combination of both. Lifestyle changes include weight loss, eating a healthy, low-sodium diet, exercising more, and limiting alcohol. This information is not intended to replace advice given to you by your health care provider. Make sure you discuss any questions you have with your health care provider. Document Released: 06/08/2005 Document Revised: 05/06/2016 Document Reviewed: 05/06/2016 Elsevier  Interactive Patient Education  [employer].

## 2018-05-08 ENCOUNTER — Ambulatory Visit (INDEPENDENT_AMBULATORY_CARE_PROVIDER_SITE_OTHER): Payer: Self-pay | Admitting: Family Medicine

## 2018-05-08 VITALS — BP 124/84 | HR 70 | Temp 99.0°F | Resp 18 | Wt 139.2 lb

## 2018-05-08 DIAGNOSIS — H04203 Unspecified epiphora, bilateral lacrimal glands: Secondary | ICD-10-CM

## 2018-05-08 DIAGNOSIS — H1013 Acute atopic conjunctivitis, bilateral: Secondary | ICD-10-CM

## 2018-05-08 MED ORDER — OLOPATADINE HCL 0.2 % OP SOLN: 1.0000 [drp] | Freq: Every day | OPHTHALMIC | 0 refills | Status: DC

## 2018-05-08 NOTE — Progress Notes (Signed)
Kelly Hartman is a 52 y.o. female who presents today with concerns of eye redness since daughter is home from college with suspected pink eye. She denies any recent other known contact and wears glasses with regular eye exams from a provider at "[facility]". She denies blurred vision or eye pain.  Review of Systems  Constitutional: Negative for chills, fever and malaise/fatigue.  HENT: Negative for congestion, ear discharge, ear pain, sinus pain and sore throat.   Eyes: Positive for redness. Negative for blurred vision, double vision, photophobia, pain and discharge.  Respiratory: Negative for cough, sputum production and shortness of breath.   Cardiovascular: Negative.  Negative for chest pain.  Gastrointestinal: Negative for abdominal pain, diarrhea, nausea and vomiting.  Genitourinary: Negative for dysuria, frequency, hematuria and urgency.  Musculoskeletal: Negative for myalgias.  Skin: Negative.   Neurological: Negative for headaches.  Endo/Heme/Allergies: Negative.   Psychiatric/Behavioral: Negative.     O: Vitals:   05/08/18 1127  BP: 124/84  Pulse: 70  Resp: 18  Temp: 99 F (37.2 C)  SpO2: 98%     Physical Exam  Constitutional: She is oriented to person, place, and time. Vital signs are normal. She appears well-developed and well-nourished. She is active.  Non-toxic appearance. She does not have a sickly appearance.  HENT:  Head: Normocephalic.  Right Ear: Hearing, tympanic membrane, external ear and ear canal normal.  Left Ear: Hearing, tympanic membrane, external ear and ear canal normal.  Nose: Nose normal.  Mouth/Throat: Uvula is midline and oropharynx is clear and moist.  Eyes: Pupils are equal, round, and reactive to light. Conjunctivae, EOM and lids are normal.  Mild redness on lower conjunctival area bilaterally- no evidence of crusting. Of note patient's left eye lower lid medial side has small growth (patient states if has been present for 5 years and is not  painful but has been increasing in size over the past few years)  Neck: Normal range of motion. Neck supple.  Cardiovascular: Normal rate, regular rhythm, normal heart sounds and normal pulses.  Pulmonary/Chest: Effort normal and breath sounds normal.  Abdominal: Soft. Bowel sounds are normal.  Musculoskeletal: Normal range of motion.  Lymphadenopathy:       Head (right side): No submental and no submandibular adenopathy present.       Head (left side): No submental and no submandibular adenopathy present.    She has no cervical adenopathy.  Neurological: She is alert and oriented to person, place, and time.  Psychiatric: She has a normal mood and affect.  Vitals reviewed.  A: 1. Allergic conjunctivitis of both eyes   2. Watery eyes    P: Discussed exam findings, diagnosis etiology and medication use and indications reviewed with patient. Follow- Up and discharge instructions provided. No emergent/urgent issues found on exam.  Patient verbalized understanding of information provided and agrees with plan of care (POC), all questions answered.  1. Allergic conjunctivitis of both eyes - Olopatadine HCl 0.2 % SOLN; Apply 1 drop to eye daily.  2. Watery eyes - Olopatadine HCl 0.2 % SOLN; Apply 1 drop to eye daily.  Discussed trial to see if watery eye symptoms respond to a [place] and advised to follow up with eye care provider in the next 7-10 days to discuss long term treatment plan. Patient's daughter did not have conjunctivitis on exam. Patient denies seasonal allergies but primary symptom is watery eyes. Patient agrees with trial and POC today.

## 2018-05-08 NOTE — Patient Instructions (Signed)
Allergic Conjunctivitis A clear membrane (conjunctiva) covers the white part of your eye and the inner surface of your eyelid. Allergic conjunctivitis happens when this membrane has inflammation. This is caused by allergies. Common causes of allergic reactions (allergens)include:  Outdoor allergens, such as:  Pollen.  Grass and weeds.  Mold spores.  Indoor allergens, such as:  Dust.  Smoke.  Mold.  Pet dander.  Animal hair. This condition can make your eye red or pink. It can also make your eye feel itchy. This condition cannot be spread from one person to another person (is not contagious). Follow these instructions at home:  Try not to be around things that you are allergic to.  Take or apply over-the-counter and prescription medicines only as told by your doctor. These include any eye drops.  Place a cool, clean washcloth on your eye for 10-20 minutes. Do this 3-4 times a day.  Do not touch or rub your eyes.  Do not wear contact lenses until the inflammation is gone. Wear glasses instead.  Do not wear eye makeup until the inflammation is gone.  Keep all follow-up visits as told by your doctor. This is important. Contact a doctor if:  Your symptoms get worse.  Your symptoms do not get better with treatment.  You have mild eye pain.  You are sensitive to light,  You have spots or blisters on your eyes.  You have pus coming from your eye.  You have a fever. Get help right away if:  You have redness, swelling, or other symptoms in only one eye.  Your vision is blurry.  You have vision changes.  You have very bad eye pain. Summary  Allergic conjunctivitis is caused by allergies. It can make your eye red or pink, and it can make your eye feel itchy.  This condition cannot be spread from one person to another person (is not contagious).  Try not to be around things that you are allergic to.  Take or apply over-the-counter and prescription medicines  only as told by your doctor. These include any eye drops.  Contact your doctor if your symptoms get worse or they do not get better with treatment. This information is not intended to replace advice given to you by your health care provider. Make sure you discuss any questions you have with your health care provider. Document Released: 11/26/2009 Document Revised: 01/31/2016 Document Reviewed: 01/31/2016 Elsevier Interactive Patient Education  2017 Elsevier Inc.  

## 2018-06-28 MED FILL — ENALAPRIL-HCTZ 5-12.5 MG TA: 5-12.5 | 30 days supply | Qty: 30 | Fill #0

## 2018-08-27 MED FILL — ENALAPRIL-HCTZ 5-12.5 MG TA: 5-12.5 | 30 days supply | Qty: 30 | Fill #1 | Status: TO

## 2018-09-06 ENCOUNTER — Ambulatory Visit (INDEPENDENT_AMBULATORY_CARE_PROVIDER_SITE_OTHER): Payer: Self-pay

## 2018-09-06 ENCOUNTER — Other Ambulatory Visit: Payer: Self-pay

## 2018-09-06 ENCOUNTER — Ambulatory Visit (INDEPENDENT_AMBULATORY_CARE_PROVIDER_SITE_OTHER): Payer: Worker's Compensation | Admitting: Orthopaedic Surgery

## 2018-09-06 ENCOUNTER — Encounter (INDEPENDENT_AMBULATORY_CARE_PROVIDER_SITE_OTHER): Payer: Self-pay | Admitting: Orthopaedic Surgery

## 2018-09-06 VITALS — BP 142/93 | HR 72 | Ht 60.0 in | Wt 145.0 lb

## 2018-09-06 DIAGNOSIS — M25512 Pain in left shoulder: Secondary | ICD-10-CM

## 2018-09-06 DIAGNOSIS — G8929 Other chronic pain: Secondary | ICD-10-CM

## 2018-09-06 NOTE — Progress Notes (Signed)
Office Visit Note   Patient: Kelly Hartman           Date of Birth: 04/06/1966           MRN: [id number] Visit Date: 09/06/2018              Requested by: No referring provider defined for this encounter. PCP: Patient, No Pcp Per   Assessment & Plan: Visit Diagnoses:  1. Chronic left shoulder pain     Plan: Patient has some evidence of mild shoulder impingement.  Currently her symptoms are not severe enough to consider subacromial injection.  If she has increased symptoms she certainly can return.  Pathophysiology discussed physical exam findings were discussed.  At this point I do not anticipate any significant impairment and no further testing is necessary at this point.  If she had increased symptoms subacromial injection was not effective then we will could consider diagnostic MRI imaging.  Follow-Up Instructions: No follow-ups on file.   Orders:  Orders Placed This Encounter  Procedures  . XR Shoulder Left   No orders of the defined types were placed in this encounter.     Procedures: No procedures performed   Clinical Data: No additional findings.   Subjective: Chief Complaint  Patient presents with  . 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  She was treated conservatively went through some physical therapy visits had some intermittent pain tickly if doing outstretch or overhead activities.  Some days she does not have any symptoms.  She is used ibuprofen off and on in the past it bothers her stomach to some degree.  She currently now works at a different skilled facility in [place].  She is here for evaluation of continued symptoms in her left shoulder.  With patient has about 30 pages including  treatment at [facility] urgent care clinic.  [facility] physical therapy notes and restriction work notes.  Patient denies chills or fever no passive injury to her shoulder no numbness or tingling in her hand.  No problems with her opposite right arm.  Review of Systems 14 point review of systems updated and is noncontributory to HPI problem.   Objective: Vital Signs: BP (!) 142/93   Pulse 72   Ht 5' (1.524 m)   Wt 145 lb (65.8 kg)   LMP 02/15/2012   BMI 28.32 kg/m   Physical Exam Constitutional:      Appearance: She is well-developed.  HENT:     Head: Normocephalic.     Right Ear: External ear normal.     Left Ear: External ear normal.  Eyes:     Pupils: Pupils are equal, round, and reactive to light.  Neck:     Thyroid: No thyromegaly.     Trachea: No tracheal deviation.  Cardiovascular:     Rate and Rhythm: Normal rate.  Pulmonary:     Effort: Pulmonary effort is normal.  Abdominal:     Palpations: Abdomen is soft.  Skin:    General: Skin is warm and dry.  Neurological:     Mental Status: She is alert and oriented to person, place, and time.  Psychiatric:        Behavior: Behavior normal.  Ortho Exam patient has mild tenderness over the long head of the biceps left shoulder only none on the right.  Positive Neer which is mild to moderate.  Negative on the opposite right shoulder.  Negative subluxation.  Negative drop arm test.  Rotator cuff internal/external rotation is normal.  Normal elbow flexion extension section her hand is normal pulses are normal no atrophy.  Specialty Comments:  No specialty comments available.  Imaging: No results found.   PMFS History: Patient Active Problem List   Diagnosis Date Noted  . Chest pain, mid sternal 04/07/2012  . Hypokalemia 04/07/2012  . Fibroids   . Anemia    Past Medical History:  Diagnosis Date  . Anemia   . Fibroids   . History of blood transfusion 2010, 2011   [place] of  breath    with exertion-d/t anemia    No family history on file.  Past Surgical History:  Procedure Laterality Date  . ABDOMINAL HYSTERECTOMY  04/07/2012   Procedure: HYSTERECTOMY ABDOMINAL;  Surgeon: Claiborne Billings A. Pamala Hurry, MD;  Location: [facility];  Service: Gynecology;  Laterality: N/A;  Requests 2 hrs.  . CESAREAN SECTION  x2   Social History   Occupational History  . Occupation: Programmer, multimedia: [employer]    Comment: Works at [employer] in [place], [place]  Tobacco Use  . Smoking status: Never Smoker  . Smokeless tobacco: Never Used  Substance and Sexual Activity  . Alcohol use: No  . Drug use: No  . Sexual activity: Not on file

## 2018-11-30 MED FILL — ENALAPRIL-HCTZ 5-12.5 MG TA: 5-12.5 | 30 days supply | Qty: 30 | Fill #0

## 2019-05-15 ENCOUNTER — Ambulatory Visit
Admission: EM | Admit: 2019-05-15 | Discharge: 2019-05-15 | Disposition: A | Discharge status: Home / Self Care | Payer: No Typology Code available for payment source | Attending: Physician Assistant | Admitting: Physician Assistant

## 2019-05-15 DIAGNOSIS — R110 Nausea: Secondary | ICD-10-CM

## 2019-05-15 DIAGNOSIS — K219 Gastro-esophageal reflux disease without esophagitis: Secondary | ICD-10-CM

## 2019-05-15 HISTORY — DX: Migraine, unspecified, not intractable, without status migrainosus: G43.909

## 2019-05-15 HISTORY — DX: Scoliosis, unspecified: M41.9

## 2019-05-15 HISTORY — DX: Essential (primary) hypertension: I10

## 2019-05-15 MED ORDER — ENALAPRIL-HYDROCHLOROTHIAZIDE 10-25 MG PO TABS: 1.0000 | ORAL_TABLET | Freq: Every day | ORAL | 0 refills | Status: AC

## 2019-05-15 MED ORDER — FAMOTIDINE 20 MG PO TABS: 20.0000 mg | ORAL_TABLET | Freq: Every day | ORAL | 0 refills | Status: DC

## 2019-05-15 NOTE — ED Provider Notes (Signed)
[facility]    CSN: [id number] Arrival date & time: 05/15/19  [id number]      History   Chief Complaint Chief Complaint  Patient presents with  . Nausea    HPI Kelly Hartman is a 53 y.o. female.   53 year old female comes in for 1 episode of nausea after eating greasy food. States had small amounts of regurgitation.  States symptoms resolved shortly after.  However, still has some bloating/full feeling to the abdomen.  Denies current nausea or vomiting.  Denies abdominal pain, diarrhea.  She has chronic constipation, last BM yesterday, hard stools.  Denies urinary symptoms such as frequency, dysuria, hematuria.  Denies fever, chills, body aches.  She has some nasal congestion that started 1 week ago, had negative Covid test.  Denies other URI symptoms such as cough, sore throat.  Denies personal or family history of heart disease.  States has personal and family history of hypertension.  She exercises 3 times a week, denies exertional fatigue, exertional chest pain, shortness of breath.  Last PCP appt 6 months ago, switched insurance and is in the process of finding new PCP. Ran out of medications 3 months ago.      Past Medical History:  Diagnosis Date  . Anemia   . Fibroids   . History of blood transfusion 2010, 2011   [facility]  . Hypertension   . Migraine   . Scoliosis   . Shortness of breath    with exertion-d/t anemia    Patient Active Problem List   Diagnosis Date Noted  . Chest pain, mid sternal 04/07/2012  . Hypokalemia 04/07/2012  . Fibroids   . Anemia     Past Surgical History:  Procedure Laterality Date  . ABDOMINAL HYSTERECTOMY  04/07/2012   Procedure: HYSTERECTOMY ABDOMINAL;  Surgeon: Claiborne Billings A. Pamala Hurry, MD;  Location: [facility];  Service: Gynecology;  Laterality: N/A;  Requests 2 hrs.  . CESAREAN SECTION  x2    OB History   No obstetric history on file.      Home Medications    Prior to Admission medications    Medication Sig Start Date End Date Taking? Authorizing Provider  diazepam (VALIUM) 5 MG tablet Take 1 tablet (5 mg total) by mouth every 6 (six) hours as needed for muscle spasms. Patient not taking: Reported on 05/06/2018 06/24/16   Avie Echevaria B, PA-C  enalapril-hydrochlorothiazide (VASERETIC) 10-25 MG tablet Take 1 tablet by mouth daily. 05/15/19   Tasia Catchings, Amy V, PA-C  famotidine (PEPCID) 20 MG tablet Take 1 tablet (20 mg total) by mouth daily. 05/15/19   Tasia Catchings, Amy V, PA-C  naproxen (NAPROSYN) 500 MG tablet Take 1 tablet (500 mg total) by mouth 2 (two) times daily. Patient not taking: Reported on 05/06/2018 06/24/16   Avie Echevaria B, PA-C  Olopatadine HCl 0.2 % SOLN Apply 1 drop to eye daily. Patient not taking: Reported on 09/06/2018 05/08/18   Shella Maxim, NP    Family History History reviewed. No pertinent family history.  Social History Social History   Tobacco Use  . Smoking status: Never Smoker  . Smokeless tobacco: Never Used  Substance Use Topics  . Alcohol use: No  . Drug use: No     Allergies   Aspirin, Cyclobenzaprine, and Fentanyl   Review of Systems Review of Systems  Reason unable to perform ROS: See HPI as above.     Physical Exam Triage Vital Signs ED Triage Vitals [05/15/19 0926]  Enc Vitals Group  BP (!) 133/93     Pulse Rate 86     Resp 18     Temp 98.2 F (36.8 C)     Temp Source Oral     SpO2 95 %     Weight      Height      Head Circumference      Peak Flow      Pain Score 5     Pain Loc      Pain Edu?      Excl. in [place]?    No data found.  Updated Vital Signs BP (!) 133/93 (BP Location: Left Arm)   Pulse 86   Temp 98.2 F (36.8 C) (Oral)   Resp 18   LMP 02/15/2012   SpO2 95%   Physical Exam Constitutional:      General: She is not in acute distress.    Appearance: She is well-developed. She is not ill-appearing, toxic-appearing or diaphoretic.  HENT:     Head: Normocephalic and atraumatic.  Eyes:      Conjunctiva/sclera: Conjunctivae normal.     Pupils: Pupils are equal, round, and reactive to light.  Cardiovascular:     Rate and Rhythm: Normal rate and regular rhythm.     Heart sounds: Normal heart sounds. No murmur. No friction rub. No gallop.   Pulmonary:     Effort: Pulmonary effort is normal.     Breath sounds: Normal breath sounds. No wheezing or rales.  Abdominal:     General: Bowel sounds are normal.     Palpations: Abdomen is soft.     Tenderness: There is no abdominal tenderness. There is no right CVA tenderness, left CVA tenderness, guarding or rebound.  Musculoskeletal:        General: No swelling or tenderness.  Skin:    General: Skin is warm and dry.  Neurological:     Mental Status: She is alert and oriented to person, place, and time.  Psychiatric:        Behavior: Behavior normal.        Judgment: Judgment normal.      UC Treatments / Results  Labs (all labs ordered are listed, but only abnormal results are displayed) Labs Reviewed - No data to display  EKG   Radiology No results found.  Procedures Procedures (including critical care time)  Medications Ordered in UC Medications - No data to display  Initial Impression / Assessment and Plan / UC Course  I have reviewed the triage vital signs and the nursing notes.  Pertinent labs & imaging results that were available during my care of the patient were reviewed by me and considered in my medical decision making (see chart for details).    Start pepcid for GERD. No RUQ tenderness, low suspicion for cholecystitis at this time. Patient initially requested COVID testing, however, declined it at discharge. Patient will be tested for COVID tomorrow by work as routine testing. Given negative COVID after starting nasal congestion, and current abdominal symptoms has resolved, no need for current testing as patient will be tested tomorrow. Return precautions given.  Patient expresses understanding and agrees to  plan.  Final Clinical Impressions(s) / UC Diagnoses   Final diagnoses:  Gastroesophageal reflux disease without esophagitis  Nausea without vomiting   ED Prescriptions    Medication Sig Dispense Auth. Provider   enalapril-hydrochlorothiazide (VASERETIC) 10-25 MG tablet Take 1 tablet by mouth daily. 30 tablet Yu, Amy V, PA-C   famotidine (PEPCID) 20 MG tablet  Take 1 tablet (20 mg total) by mouth daily. 30 tablet Ok Edwards, PA-C     PDMP not reviewed this encounter.   Ok Edwards, PA-C 05/15/19 1027

## 2019-05-15 NOTE — ED Triage Notes (Signed)
Pt c/o nausea and vomit x1 a small amount after eating a egg, sausage, and grilled chicken biscuit. Pt feels like GERD. Pt states in process of switching PCP's and is out of her enalapril/hctz 5-12.5.

## 2019-05-15 NOTE — Discharge Instructions (Addendum)
Although low suspicion, given profession with nausea, we will order COVID testing. Testing sent. I would like you to quarantine until testing results. You can take over the counter flonase/nasacort to help with nasal congestion/drainage. Pepcid for acid reflux. If experiencing shortness of breath, trouble breathing, go to the emergency department for further evaluation needed.   BP medication refilled for 1 month. Please follow up with PCP for further evaluation needed.

## 2019-05-15 NOTE — ED Notes (Signed)
Pt decided not to get COVID testing. Pt states gets tested weekly at work. Last test was last Tuesday and will get testing again tomorrow. Pt educated on s/sx's of COVID.

## 2019-09-08 ENCOUNTER — Other Ambulatory Visit: Payer: Self-pay | Admitting: Internal Medicine

## 2019-09-08 DIAGNOSIS — N644 Mastodynia: Secondary | ICD-10-CM

## 2019-10-04 ENCOUNTER — Other Ambulatory Visit: Payer: Self-pay | Admitting: Internal Medicine

## 2019-10-04 ENCOUNTER — Ambulatory Visit
Admission: RE | Admit: 2019-10-04 | Discharge: 2019-10-04 | Disposition: A | Discharge status: Home / Self Care | Payer: No Typology Code available for payment source | Source: Ambulatory Visit | Attending: Internal Medicine | Admitting: Internal Medicine

## 2019-10-04 ENCOUNTER — Other Ambulatory Visit: Payer: Self-pay

## 2019-10-04 DIAGNOSIS — N6310 Unspecified lump in the right breast, unspecified quadrant: Secondary | ICD-10-CM

## 2019-10-04 DIAGNOSIS — N644 Mastodynia: Secondary | ICD-10-CM

## 2019-10-10 ENCOUNTER — Telehealth: Payer: Self-pay

## 2019-10-10 NOTE — Telephone Encounter (Signed)

## 2019-10-10 NOTE — Patient Instructions (Signed)
Thank you for choosing Primary Care at [facility] to be your medical home!    Jacquelynne Hirsch was seen by Melina Schools, DO today.   Priscille Heidelberg primary care provider is Phill Myron, DO.   For the best care possible, you should try to see Phill Myron, DO whenever you come to the clinic.   We look forward to seeing you again soon!  If you have any questions about your visit today, please call us at [phone] or feel free to reach your primary care provider via [place].

## 2019-10-11 ENCOUNTER — Encounter: Payer: Self-pay | Admitting: Gastroenterology

## 2019-10-11 ENCOUNTER — Ambulatory Visit (INDEPENDENT_AMBULATORY_CARE_PROVIDER_SITE_OTHER): Payer: No Typology Code available for payment source | Admitting: Internal Medicine

## 2019-10-11 ENCOUNTER — Encounter: Payer: Self-pay | Admitting: Internal Medicine

## 2019-10-11 VITALS — BP 131/83 | HR 70 | Temp 97.3°F | Resp 17 | Ht 63.0 in | Wt 147.0 lb

## 2019-10-11 DIAGNOSIS — G8929 Other chronic pain: Secondary | ICD-10-CM

## 2019-10-11 DIAGNOSIS — Z7689 Persons encountering health services in other specified circumstances: Secondary | ICD-10-CM | POA: Diagnosis not present

## 2019-10-11 DIAGNOSIS — M549 Dorsalgia, unspecified: Secondary | ICD-10-CM

## 2019-10-11 DIAGNOSIS — Z114 Encounter for screening for human immunodeficiency virus [HIV]: Secondary | ICD-10-CM

## 2019-10-11 DIAGNOSIS — I10 Essential (primary) hypertension: Secondary | ICD-10-CM | POA: Insufficient documentation

## 2019-10-11 DIAGNOSIS — Z862 Personal history of diseases of the blood and blood-forming organs and certain disorders involving the immune mechanism: Secondary | ICD-10-CM

## 2019-10-11 DIAGNOSIS — Z1211 Encounter for screening for malignant neoplasm of colon: Secondary | ICD-10-CM

## 2019-10-11 DIAGNOSIS — Z1159 Encounter for screening for other viral diseases: Secondary | ICD-10-CM

## 2019-10-11 MED ORDER — DIAZEPAM 5 MG PO TABS
5.0000 mg | ORAL_TABLET | Freq: Four times a day (QID) | ORAL | 2 refills | Status: DC | PRN
Start: 2019-10-11 — End: 2020-04-10

## 2019-10-11 NOTE — Progress Notes (Signed)
  Subjective:    Kelly Hartman - 54 y.o. female MRN [id number]  Date of birth: 1965/11/18  HPI  Kelly Hartman is to establish care. Patient has a PMH significant for HTN, anemia, fibroids.   Chronic HTN Disease Monitoring:  Home BP Monitoring - Tries to check at least weekly.  Chest pain- no  Dyspnea- no Headache - yes, she has a h/o migraines and will monitor more frequently with that   Medications: Enalapril-HCTZ 10-25 mg  Compliance- yes Lightheadedness- no  Edema- no   Back Pain: Reports has chronic back muscle spasms related to her scoliosis. She is careful to lift correctly and try not to overdo things but when she has muscle tension she takes Valium. She has tried several muscle relaxants and they all give her paradoxical type reactions.    ROS per HPI   Health Maintenance:  Health Maintenance Due  Topic Date Due  . Hepatitis C Screening  Never done  . HIV Screening  Never done  . TETANUS/TDAP  Never done  . PAP SMEAR-Modifier  Never done  . COLONOSCOPY  Never done     Past Medical History: Patient Active Problem List   Diagnosis Date Noted  . Fibroids   . Anemia     Social History   reports that she has never smoked. She has never used smokeless tobacco. She reports that she does not drink alcohol or use drugs.   Family History  family history is not on file.   Medications: reviewed and updated   Objective:   Physical Exam BP 131/83   Pulse 70   Temp (!) 97.3 F (36.3 C) (Temporal)   Resp 17   Ht 5\' 3"  (1.6 m)   Wt 147 lb (66.7 kg)   LMP 02/15/2012   SpO2 96%   BMI 26.04 kg/m  Physical Exam  Constitutional: She is oriented to person, Hartman, and time and well-developed, well-nourished, and in no distress. No distress.  HENT:  Head: Normocephalic and atraumatic.  Eyes: Conjunctivae and EOM are normal.  Cardiovascular: Normal rate, regular rhythm and normal heart sounds.  No murmur heard. Pulmonary/Chest: Effort normal  and breath sounds normal. No respiratory distress.  Musculoskeletal:        General: Normal range of motion.  Neurological: She is alert and oriented to person, Hartman, and time.  Skin: Skin is warm and dry. She is not diaphoretic.  Psychiatric: Affect and judgment normal.    Assessment & Plan:   1. Encounter to establish care  2. Essential hypertension BP well controlled today. Continue current medications. Will check CMET.  - CBC with Differential - Comprehensive metabolic panel  3. Need for hepatitis C screening test - Hepatitis C Antibody  4. Screening for HIV (human immunodeficiency virus) - HIV antibody (with reflex)  5. Colon cancer screening - Ambulatory referral to Gastroenterology  6. History of anemia Per patient, related to heavy menstrual periods with fibroids. She had total hysterectomy in 2013.  - CBC with Differential  7. Chronic back pain, unspecified back location, unspecified back pain laterality - diazepam (VALIUM) 5 MG tablet; Take 1 tablet (5 mg total) by mouth every 6 (six) hours as needed for muscle spasms.  Dispense: 30 tablet; Refill: [place], D.O. 10/11/2019, 1:52 PM [facility] at [facility]

## 2019-10-12 LAB — CBC WITH DIFFERENTIAL/PLATELET
Basophils Absolute: 0.1 10*3/uL (ref 0.0–0.2)
Basos: 1 %
EOS (ABSOLUTE): 0.1 10*3/uL (ref 0.0–0.4)
Eos: 1 %
Hematocrit: 37.7 % (ref 34.0–46.6)
Hemoglobin: 13.1 g/dL (ref 11.1–15.9)
Immature Grans (Abs): 0 10*3/uL (ref 0.0–0.1)
Immature Granulocytes: 0 %
Lymphocytes Absolute: 2.9 10*3/uL (ref 0.7–3.1)
Lymphs: 44 %
MCH: 30.5 pg (ref 26.6–33.0)
MCHC: 34.7 g/dL (ref 31.5–35.7)
MCV: 88 fL (ref 79–97)
Monocytes Absolute: 0.4 10*3/uL (ref 0.1–0.9)
Monocytes: 7 %
Neutrophils Absolute: 3.1 10*3/uL (ref 1.4–7.0)
Neutrophils: 47 %
Platelets: 337 10*3/uL (ref 150–450)
RBC: 4.3 x10E6/uL (ref 3.77–5.28)
RDW: 12.2 % (ref 11.7–15.4)
WBC: 6.5 10*3/uL (ref 3.4–10.8)

## 2019-10-12 LAB — COMPREHENSIVE METABOLIC PANEL
ALT: 11 IU/L (ref 0–32)
AST: 13 IU/L (ref 0–40)
Albumin/Globulin Ratio: 1.6 (ref 1.2–2.2)
Albumin: 4.3 g/dL (ref 3.8–4.9)
Alkaline Phosphatase: 104 IU/L (ref 39–117)
BUN/Creatinine Ratio: 10 (ref 9–23)
BUN: 8 mg/dL (ref 6–24)
Bilirubin Total: 0.2 mg/dL (ref 0.0–1.2)
CO2: 25 mmol/L (ref 20–29)
Calcium: 9.4 mg/dL (ref 8.7–10.2)
Chloride: 101 mmol/L (ref 96–106)
Creatinine, Ser: 0.82 mg/dL (ref 0.57–1.00)
GFR calc Af Amer: 93 mL/min/{1.73_m2} (ref >59)
GFR calc non Af Amer: 81 mL/min/{1.73_m2} (ref >59)
Globulin, Total: 2.7 g/dL (ref 1.5–4.5)
Glucose: 85 mg/dL (ref 65–99)
Potassium: 4.3 mmol/L (ref 3.5–5.2)
Sodium: 140 mmol/L (ref 134–144)
Total Protein: 7 g/dL (ref 6.0–8.5)

## 2019-10-12 LAB — HIV ANTIBODY (ROUTINE TESTING W REFLEX): HIV Screen 4th Generation wRfx: NONREACTIVE

## 2019-10-12 LAB — HEPATITIS C ANTIBODY: Hep C Virus Ab: <0.1 s/co ratio (ref 0.0–0.9)

## 2019-10-13 NOTE — Progress Notes (Signed)
Normal lab letter mailed.

## 2019-11-23 ENCOUNTER — Ambulatory Visit: Payer: No Typology Code available for payment source | Admitting: Internal Medicine

## 2019-11-28 ENCOUNTER — Encounter: Payer: No Typology Code available for payment source | Admitting: Gastroenterology

## 2020-01-03 ENCOUNTER — Encounter: Payer: Self-pay | Admitting: Internal Medicine

## 2020-02-16 ENCOUNTER — Telehealth: Payer: Self-pay | Admitting: *Deleted

## 2020-02-16 NOTE — Telephone Encounter (Signed)
Patient called back and cancelled colon and PV.

## 2020-02-16 NOTE — Telephone Encounter (Signed)
Called patient, no answer, left a message

## 2020-02-16 NOTE — Telephone Encounter (Signed)
Patient no show PV today, called patient and left a message for her to call us back to reschedule the PV before 5 pm today.

## 2020-02-27 ENCOUNTER — Encounter: Payer: No Typology Code available for payment source | Admitting: Internal Medicine

## 2020-04-02 ENCOUNTER — Ambulatory Visit: Payer: BC Managed Care – PPO | Admitting: Physical Therapy

## 2020-04-11 ENCOUNTER — Telehealth: Payer: No Typology Code available for payment source | Admitting: Internal Medicine

## 2020-04-11 DIAGNOSIS — I10 Essential (primary) hypertension: Secondary | ICD-10-CM

## 2020-04-17 ENCOUNTER — Ambulatory Visit: Payer: No Typology Code available for payment source | Attending: Adult Medicine | Admitting: Physical Therapy

## 2020-08-31 ENCOUNTER — Encounter: Payer: Self-pay | Admitting: Emergency Medicine

## 2020-08-31 ENCOUNTER — Other Ambulatory Visit: Payer: Self-pay

## 2020-08-31 ENCOUNTER — Ambulatory Visit
Admission: EM | Admit: 2020-08-31 | Discharge: 2020-08-31 | Disposition: A | Discharge status: Home / Self Care | Payer: No Typology Code available for payment source | Attending: Family Medicine | Admitting: Family Medicine

## 2020-08-31 DIAGNOSIS — R519 Headache, unspecified: Secondary | ICD-10-CM | POA: Diagnosis not present

## 2020-08-31 DIAGNOSIS — I10 Essential (primary) hypertension: Secondary | ICD-10-CM

## 2020-08-31 DIAGNOSIS — H9201 Otalgia, right ear: Secondary | ICD-10-CM

## 2020-08-31 DIAGNOSIS — J0100 Acute maxillary sinusitis, unspecified: Secondary | ICD-10-CM

## 2020-08-31 MED ORDER — AMOXICILLIN 875 MG PO TABS: 875.0000 mg | ORAL_TABLET | Freq: Two times a day (BID) | ORAL | 0 refills | Status: AC

## 2020-08-31 MED ORDER — TRAMADOL HCL 50 MG PO TABS: 50.0000 mg | ORAL_TABLET | Freq: Four times a day (QID) | ORAL | 0 refills | Status: AC | PRN

## 2020-08-31 NOTE — ED Triage Notes (Signed)
Headache for one week.  Reports right ear pain

## 2020-08-31 NOTE — Discharge Instructions (Addendum)
Your blood pressure was noted to be elevated during your visit today. If you are currently taking medication for high blood pressure, please ensure you are taking this as directed. If you do not have a history of high blood pressure and your blood pressure remains persistently elevated, you may need to begin taking a medication at some point. You may return here within the next few days to recheck if unable to see your primary care provider or if you do not have a one.  BP (S) (!) 147/91 (BP Location: Left Arm) Comment: has not taken blood pressure medicine  Pulse 70   Temp 98.1 F (36.7 C) (Oral)   Resp 19   LMP 02/15/2012   SpO2 96%   BP Readings from Last 3 Encounters:  08/31/20 (S) (!) 147/91  10/11/19 131/83  05/15/19 (!) 133/93

## 2020-09-02 NOTE — ED Provider Notes (Signed)
[facility]   [id number] 08/31/20 Arrival Time: 5284  ASSESSMENT & PLAN:  1. Acute nonintractable headache, unspecified headache type   2. Acute otalgia, right   3. Acute non-recurrent maxillary sinusitis   4. Elevated blood pressure reading in office with diagnosis of hypertension     Meds ordered this encounter  Medications  . traMADol (ULTRAM) 50 MG tablet    Sig: Take 1 tablet (50 mg total) by mouth every 6 (six) hours as needed (headache).    Dispense:  8 tablet    Refill:  0  . amoxicillin (AMOXIL) 875 MG tablet    Sig: Take 1 tablet (875 mg total) by mouth 2 (two) times daily for 10 days.    Dispense:  20 tablet    Refill:  0   HA likely related to sinus and ear. Discussed. OTC symptom care as needed. Ensure adequate fluid intake and rest.    Discharge Instructions      Your blood pressure was noted to be elevated during your visit today. If you are currently taking medication for high blood pressure, please ensure you are taking this as directed. If you do not have a history of high blood pressure and your blood pressure remains persistently elevated, you may need to begin taking a medication at some point. You may return here within the next few days to recheck if unable to see your primary care provider or if you do not have a one.  BP (S) (!) 147/91 (BP Location: Left Arm) Comment: has not taken blood pressure medicine  Pulse 70   Temp 98.1 F (36.7 C) (Oral)   Resp 19   LMP 02/15/2012   SpO2 96%   BP Readings from Last 3 Encounters:  08/31/20 (S) (!) 147/91  10/11/19 131/83  05/15/19 (!) 133/93          Reviewed expectations re: course of current medical issues. Questions answered. Outlined signs and symptoms indicating need for more acute intervention. Patient verbalized understanding. After Visit Summary given.   SUBJECTIVE: History from: patient.  Arcola Freshour is a 55 y.o. female who presents with complaint of frontal HA  along with sinus congestion/pressure for over one week; now with R ear pain; no drainage or bleeding; dull. No fever reported. No resp symptoms. No OTC tx.  Social History   Tobacco Use  Smoking Status Never Smoker  Smokeless Tobacco Never Used    Increased blood pressure noted today. Reports that she is tx for HTN.  She reports taking medications as instructed, no chest pain on exertion, no dyspnea on exertion, no swelling of ankles, no orthostatic dizziness or lightheadedness, no orthopnea or paroxysmal nocturnal dyspnea and no palpitations.    OBJECTIVE:  Vitals:   08/31/20 1054  BP: (S) (!) 147/91  Pulse: 70  Resp: 19  Temp: 98.1 F (36.7 C)  TempSrc: Oral  SpO2: 96%     General appearance: alert; appears fatigued Ear Canal: normal TM: right: erythematous, dull Neck: supple without LAD Lungs: unlabored respirations, symmetrical air entry; cough: absent; no respiratory distress Skin: warm and dry Neuro: CN 2-12 grossly intact Psychological: alert and cooperative; normal mood and affect  Allergies  Allergen Reactions  . Aspirin Nausea Only  . Cyclobenzaprine Other (See Comments)    NO MUSCLE RELAXERS. Cause anxiety and hallucinations  . Fentanyl Itching    Itching and hallucinations from epidural    Past Medical History:  Diagnosis Date  . Anemia   . Fibroids   .  History of blood transfusion 2010, 2011   [facility]  . Hypertension   . Migraine   . Scoliosis   . Shortness of breath    with exertion-d/t anemia   History reviewed. No pertinent family history. Social History   Socioeconomic History  . Marital status: Divorced    Spouse name: Not on file  . Number of children: Not on file  . Years of education: Not on file  . Highest education level: Not on file  Occupational History  . Occupation: Programmer, multimedia: [employer]    Comment: Works at [employer] in [place], [place]  Tobacco Use  . Smoking status: Never Smoker  . Smokeless  tobacco: Never Used  Substance and Sexual Activity  . Alcohol use: No  . Drug use: No  . Sexual activity: Not on file  Other Topics Concern  . Not on file  Social History Narrative   Lives with daughter.   Social Determinants of Health   Financial Resource Strain: Not on file  Food Insecurity: Not on file  Transportation Needs: Not on file  Physical Activity: Not on file  Stress: Not on file  Social Connections: Not on file  Intimate Partner Violence: Not on file            Vanessa Kick, MD 09/02/20 [phone]

## 2021-01-03 ENCOUNTER — Ambulatory Visit
Admission: EM | Admit: 2021-01-03 | Discharge: 2021-01-03 | Disposition: A | Discharge status: Home / Self Care | Payer: No Typology Code available for payment source | Attending: Urgent Care | Admitting: Urgent Care

## 2021-01-03 ENCOUNTER — Other Ambulatory Visit: Payer: Self-pay

## 2021-01-03 DIAGNOSIS — J3089 Other allergic rhinitis: Secondary | ICD-10-CM

## 2021-01-03 DIAGNOSIS — H04203 Unspecified epiphora, bilateral lacrimal glands: Secondary | ICD-10-CM

## 2021-01-03 DIAGNOSIS — J3489 Other specified disorders of nose and nasal sinuses: Secondary | ICD-10-CM

## 2021-01-03 DIAGNOSIS — Z20822 Contact with and (suspected) exposure to covid-19: Secondary | ICD-10-CM

## 2021-01-03 MED ORDER — LEVOCETIRIZINE DIHYDROCHLORIDE 5 MG PO TABS: 5.0000 mg | ORAL_TABLET | Freq: Every evening | ORAL | 0 refills | Status: AC

## 2021-01-03 MED ORDER — FLUTICASONE PROPIONATE 50 MCG/ACT NA SUSP: 2.0000 | Freq: Every day | NASAL | 12 refills | Status: AC

## 2021-01-03 MED ORDER — AZELASTINE HCL 0.05 % OP SOLN: 1.0000 [drp] | Freq: Two times a day (BID) | OPHTHALMIC | 12 refills | Status: DC

## 2021-01-03 NOTE — ED Provider Notes (Signed)
[facility]   MRN: [id number] DOB: 09-22-1965  Subjective:   Kelly Hartman is a 55 y.o. female presenting for longstanding history of persistent runny and stuffy nose, itchy and watery eyes mainly when she is at work.  States that part of the problem is wearing her mask.  She does not take any particular medications for allergic rhinitis.  Denies cough, chest pain, shortness of breath or wheezing, fever, body aches, eye pain, sinus pain.  She is primarily here because of needing a COVID test for her work.  She is not a smoker.  No current facility-administered medications for this encounter.  Current Outpatient Medications:    enalapril-hydrochlorothiazide (VASERETIC) 10-25 MG tablet, Take 1 tablet by mouth daily., Disp: 30 tablet, Rfl: 0   traMADol (ULTRAM) 50 MG tablet, Take 1 tablet (50 mg total) by mouth every 6 (six) hours as needed (headache)., Disp: 8 tablet, Rfl: 0   Allergies  Allergen Reactions   Aspirin Nausea Only   Cyclobenzaprine Other (See Comments)    NO MUSCLE RELAXERS. Cause anxiety and hallucinations   Fentanyl Itching    Itching and hallucinations from epidural    Past Medical History:  Diagnosis Date   Anemia    Fibroids    History of blood transfusion 2010, 2011   [facility]   Hypertension    Migraine    Scoliosis    Shortness of breath    with exertion-d/t anemia     Past Surgical History:  Procedure Laterality Date   ABDOMINAL HYSTERECTOMY  04/07/2012   Procedure: HYSTERECTOMY ABDOMINAL;  Surgeon: Claiborne Billings A. Pamala Hurry, MD;  Location: [facility];  Service: Gynecology;  Laterality: N/A;  Requests 2 hrs.   CESAREAN SECTION  x2    No family history on file.  Social History   Tobacco Use   Smoking status: Never   Smokeless tobacco: Never  Substance Use Topics   Alcohol use: No   Drug use: No    ROS   Objective:   Vitals: BP (!) 151/91 (BP Location: Left Arm)   Pulse 64   Temp 98 F (36.7 C) (Oral)   Resp  18   LMP 02/15/2012   SpO2 96%   Physical Exam Constitutional:      General: She is not in acute distress.    Appearance: She is well-developed. She is not ill-appearing, toxic-appearing or diaphoretic.  HENT:     Head: Normocephalic and atraumatic.     Right Ear: Tympanic membrane and ear canal normal. No drainage or tenderness. No middle ear effusion. Tympanic membrane is not erythematous.     Left Ear: Tympanic membrane and ear canal normal. No drainage or tenderness.  No middle ear effusion. Tympanic membrane is not erythematous.     Nose: Congestion and rhinorrhea present.     Comments: Nasal mucosa boggy and edematous.    Mouth/Throat:     Mouth: Mucous membranes are moist. No oral lesions.     Pharynx: No pharyngeal swelling, oropharyngeal exudate, posterior oropharyngeal erythema or uvula swelling.     Tonsils: No tonsillar exudate or tonsillar abscesses.  Eyes:     General: No scleral icterus.       Right eye: No discharge.        Left eye: No discharge.     Extraocular Movements: Extraocular movements intact.     Right eye: Normal extraocular motion.     Left eye: Normal extraocular motion.     Conjunctiva/sclera: Conjunctivae normal.  Pupils: Pupils are equal, round, and reactive to light.  Cardiovascular:     Rate and Rhythm: Normal rate.  Pulmonary:     Effort: Pulmonary effort is normal.  Musculoskeletal:     Cervical back: Normal range of motion and neck supple.  Lymphadenopathy:     Cervical: No cervical adenopathy.  Skin:    General: Skin is warm and dry.  Neurological:     General: No focal deficit present.     Mental Status: She is alert and oriented to person, place, and time.  Psychiatric:        Mood and Affect: Mood normal.        Behavior: Behavior normal.      Assessment and Plan :   PDMP not reviewed this encounter.  1. Allergic rhinitis due to other allergic trigger, unspecified seasonality   2. Encounter for screening laboratory  testing for COVID-19 virus   3. Watery eyes   4. Stuffy and runny nose     Low suspicion for COVID-19 given the longstanding nature, chronic nature of her upper respiratory symptoms.  Recommended starting Flonase, levocetirizine daily for allergic rhinitis.  Provided her with a note for work.  Use supportive care otherwise.  COVID-19 testing pending counseled patient on potential for adverse effects with medications prescribed/recommended today, ER and return-to-clinic precautions discussed, patient verbalized understanding.    Jaynee Eagles, PA-C 01/03/21 1205

## 2021-01-03 NOTE — ED Triage Notes (Signed)
Pt c/o watery/itchy eyes and scratchy throat for the past few weeks. States taking Allergy meds with relief. States developed nasal congestion today and will need covid tested for work.

## 2021-01-04 LAB — NOVEL CORONAVIRUS, NAA: SARS-CoV-2, NAA: NOT DETECTED

## 2021-01-04 LAB — SARS-COV-2, NAA 2 DAY TAT

## 2021-04-03 IMAGING — MG DIGITAL DIAGNOSTIC BILAT W/ TOMO W/ CAD
6 of 10 series · 6 of 30 positions shown · non-contrast
Comparison: Previous exam(s).

ACR Breast Density Category a: The breast tissue is almost entirely
fatty.

CLINICAL DATA: Diffuse left breast pain. No focal symptoms on the
left. Annual mammography.

EXAM:
DIGITAL DIAGNOSTIC BILATERAL MAMMOGRAM WITH CAD AND TOMO
ULTRASOUND RIGHT BREAST

[L MLO synth-2D]
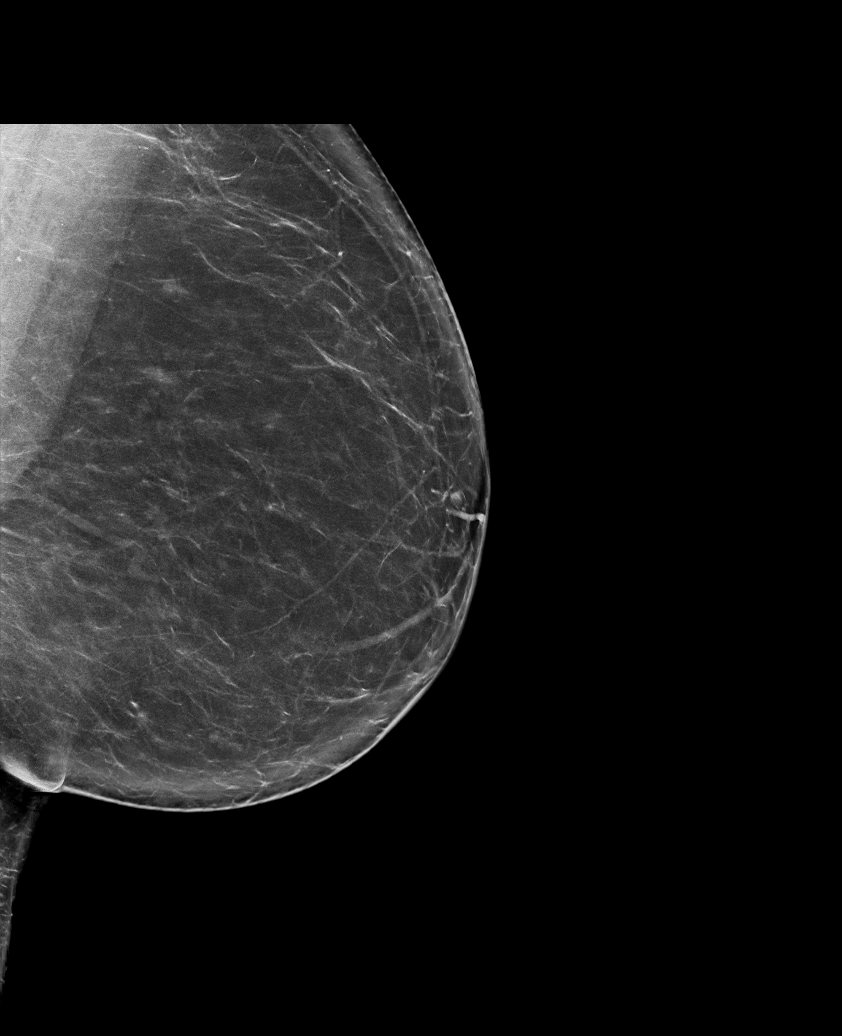

[L CC synth-2D (1 of 2)]
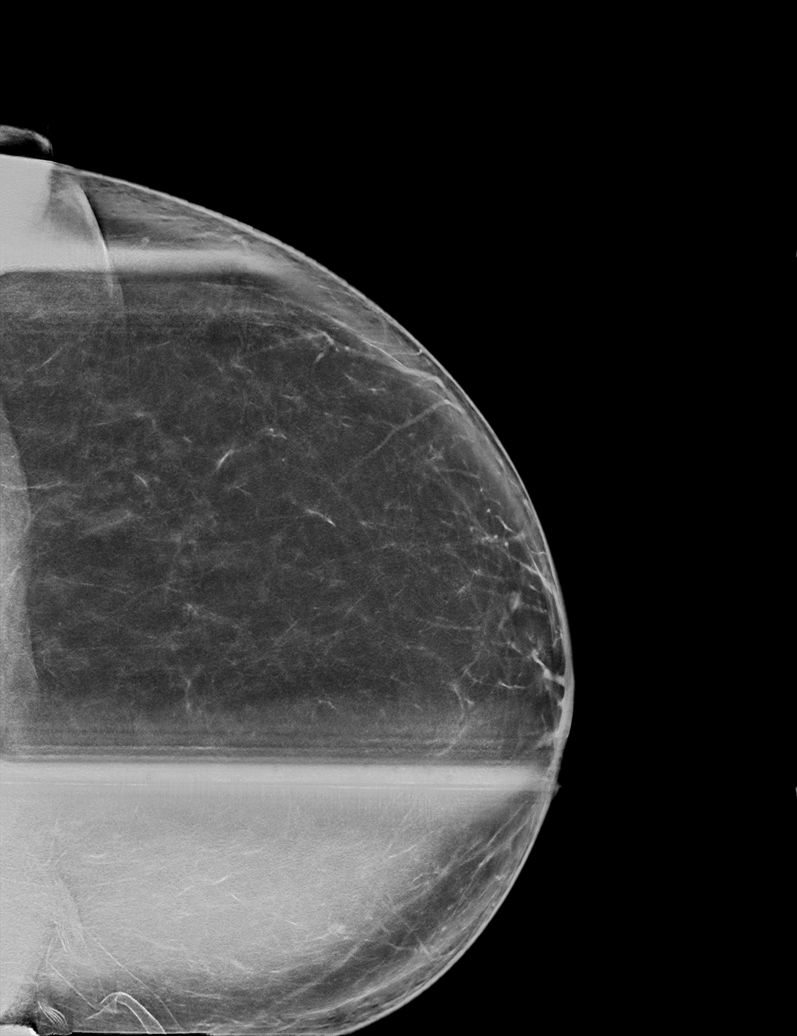

[R CC synth-2D]
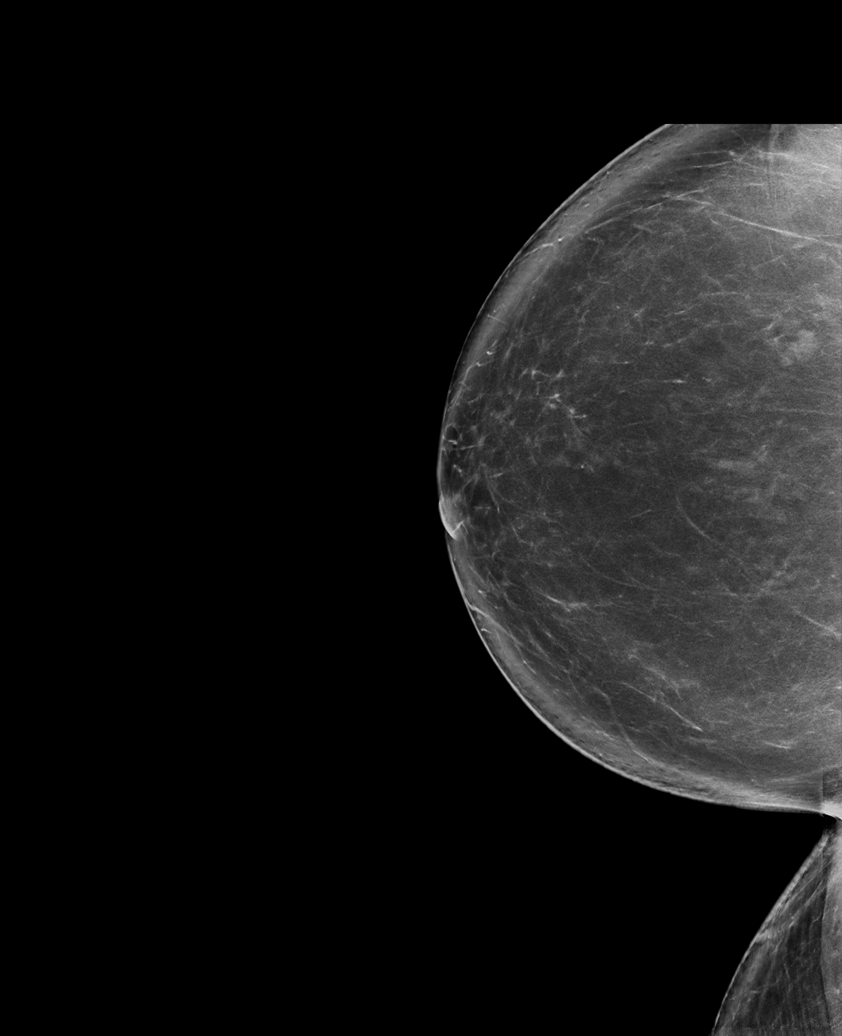

[L CC synth-2D (2 of 2)]
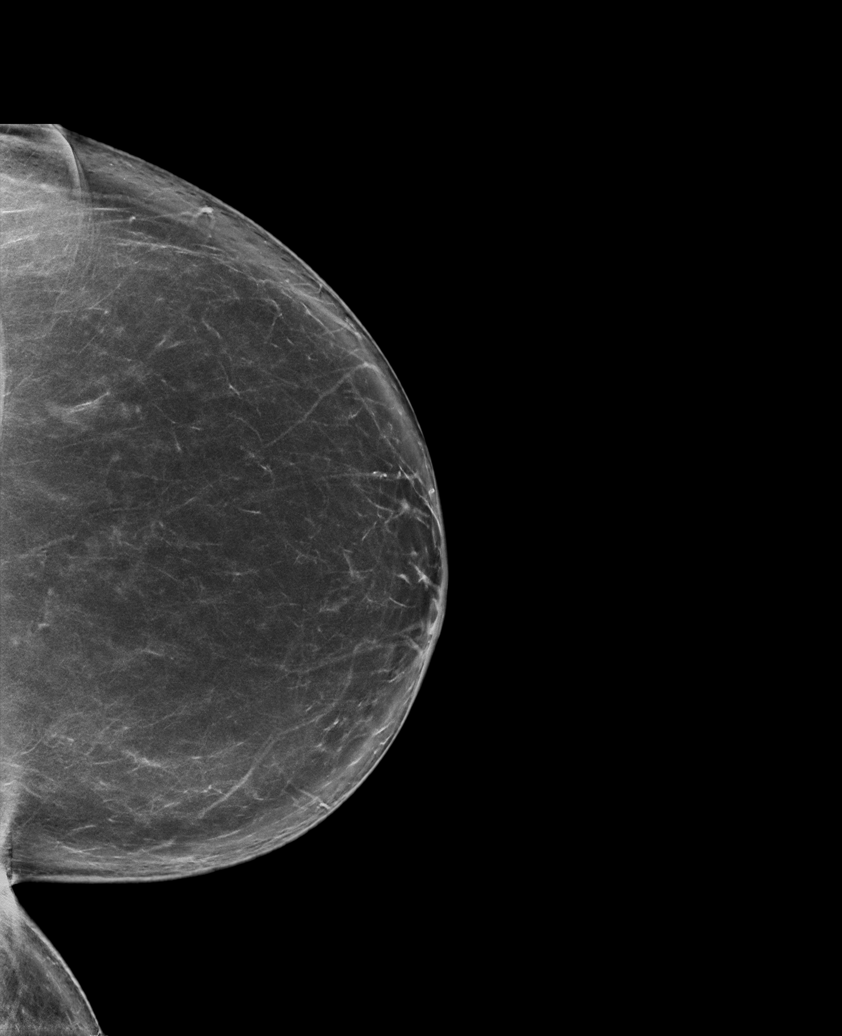

[R MLO synth-2D]
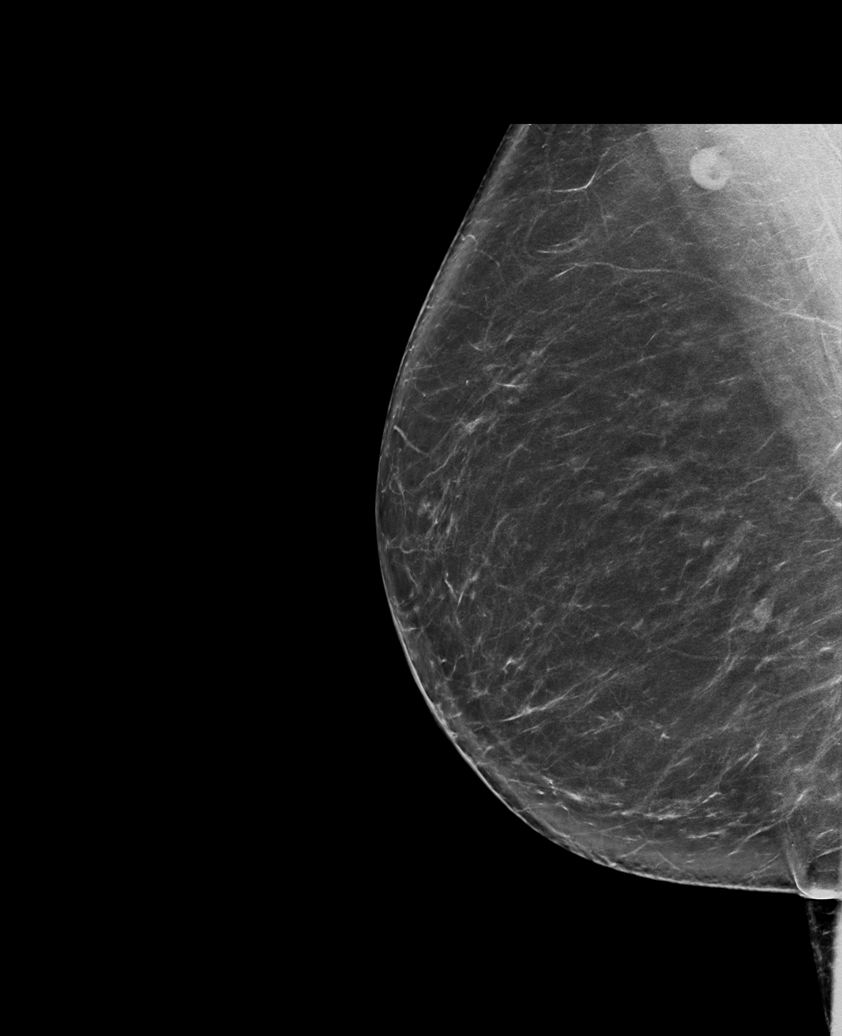

[L CC tomo · tomo slice 45/88.0]
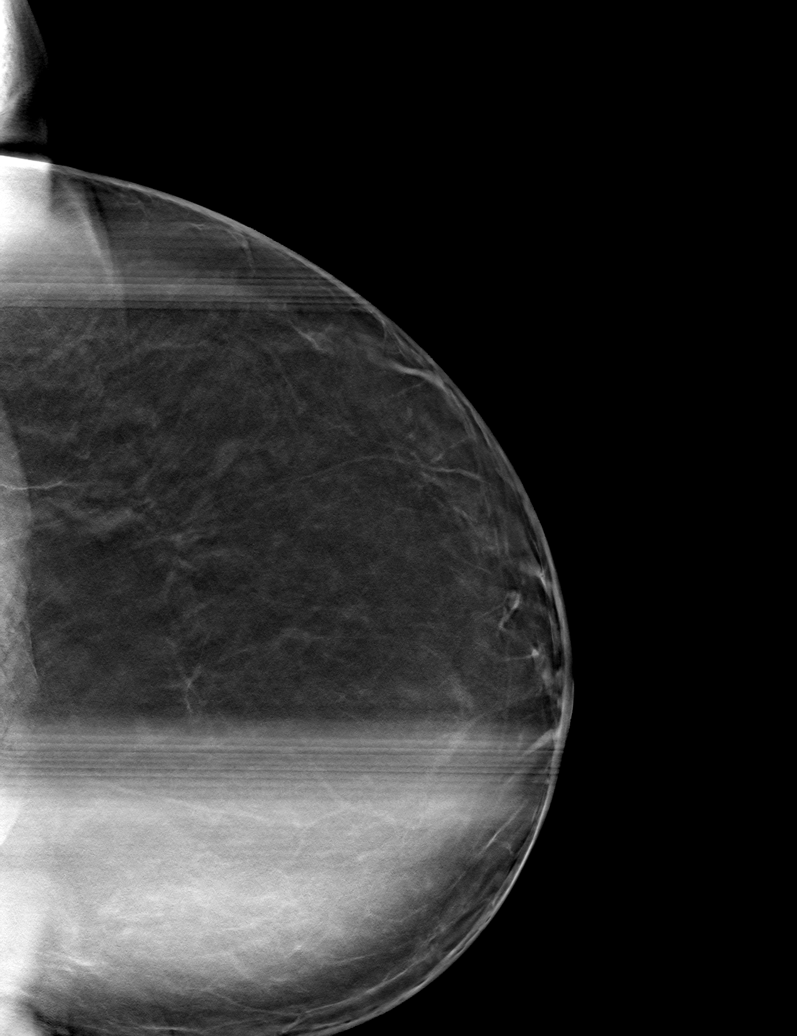

[6 of 30 positions shown; findings below may reference images not displayed]

FINDINGS: There is a new mass in the lateral inferior right breast. No
suspicious findings are seen on the left. No other evidence of
malignancy in either breast.

Mammographic images were processed with CAD.

On physical exam, no suspicious lumps are identified.

Targeted ultrasound is performed, showing a cluster of cysts in the
right breast at 8 o'clock, 7 cm from the nipple correlating with the
mammographically identified mass.
IMPRESSION: There is a cluster of cysts on the right requiring no additional
follow-up. No cause for the patient's pain identified. No suspicious
findings in either breast.

RECOMMENDATION:
Annual screening mammography. Treatment of the patient's diffuse
left breast pain should be based on clinical and physical exam given
lack of imaging findings.

I have discussed the findings and recommendations with the patient.
If applicable, a reminder letter will be sent to the patient
regarding the next appointment.

BI-RADS CATEGORY  2: Benign.

## 2021-04-03 IMAGING — US US BREAST*R* LIMITED INC AXILLA
1 series · 11 of 11 positions shown · non-contrast
Comparison: Previous exam(s).

ACR Breast Density Category a: The breast tissue is almost entirely
fatty.

CLINICAL DATA: Diffuse left breast pain. No focal symptoms on the
left. Annual mammography.

EXAM:
DIGITAL DIAGNOSTIC BILATERAL MAMMOGRAM WITH CAD AND TOMO
ULTRASOUND RIGHT BREAST

[Series 1: us breast*right* limited inc axilla · 0.07mm/px · 11 of 11 slices shown]
[im 1/11]
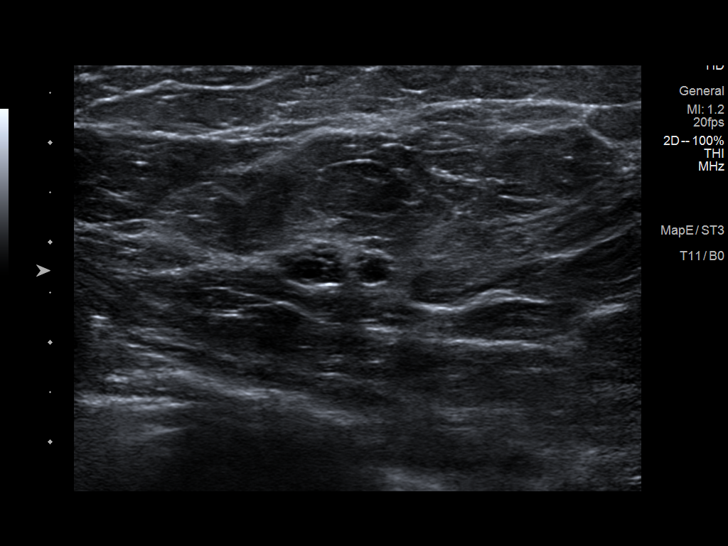
[im 2/11]
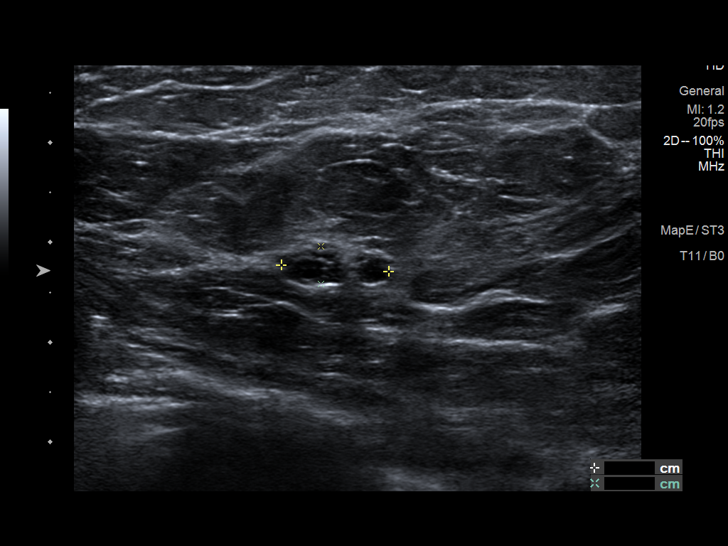
[im 3/11]
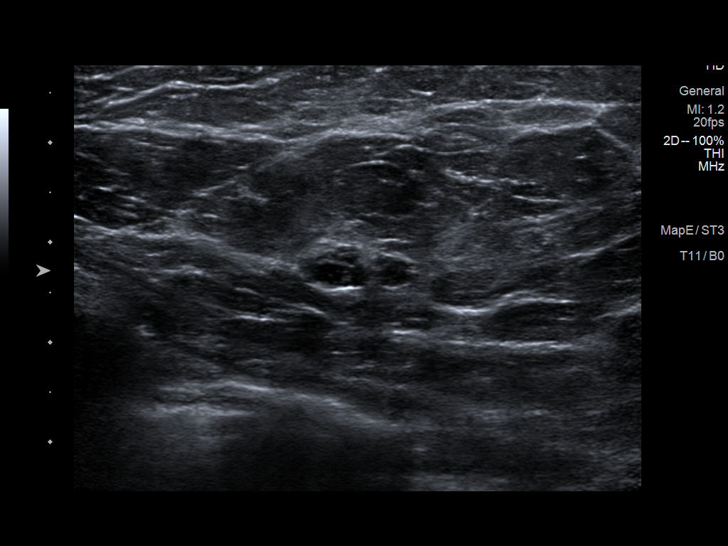
[im 4/11]
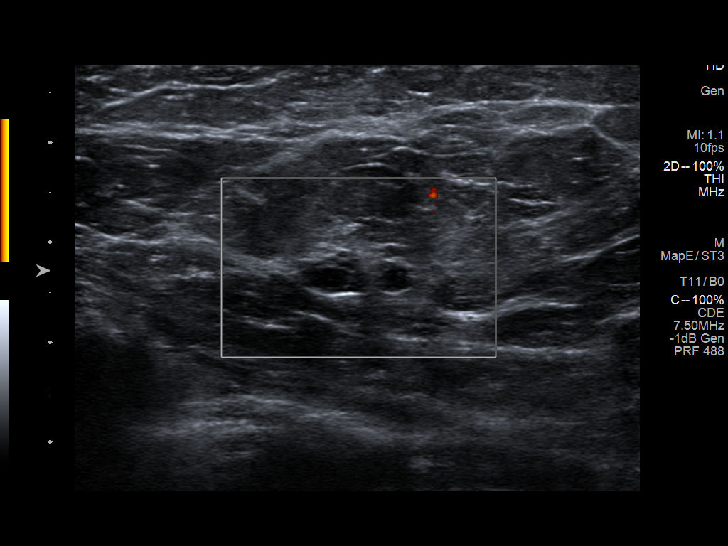
[im 5/11]
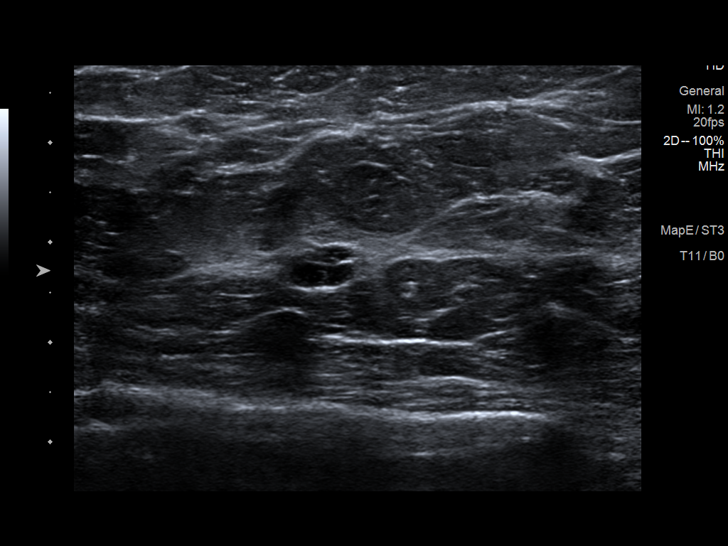
[im 6/11]
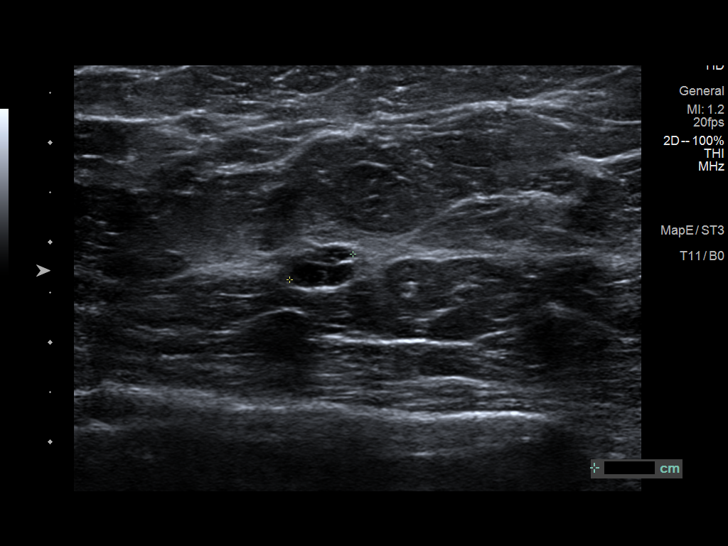
[im 7/11]
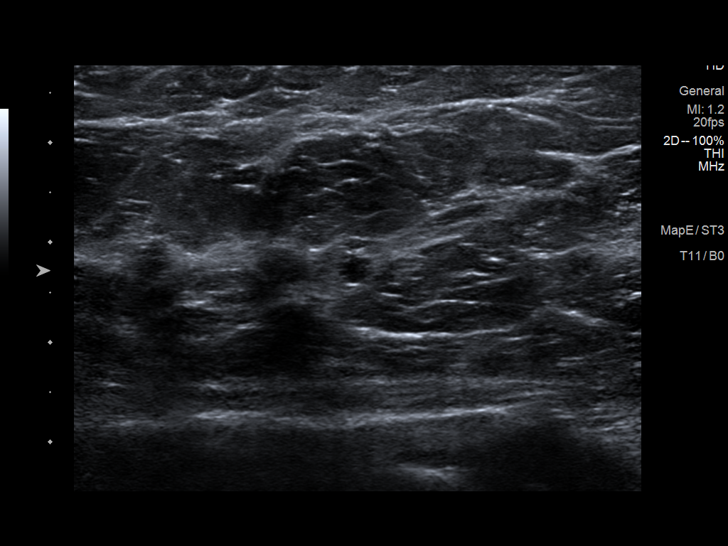
[im 8/11]
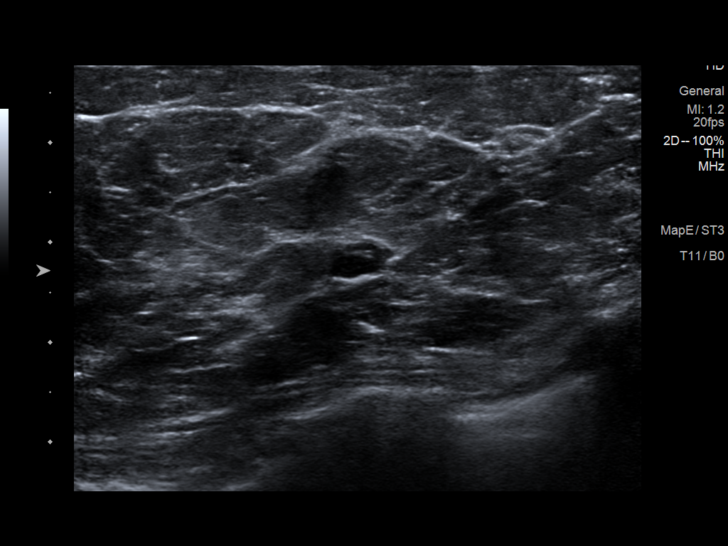
[im 9/11]
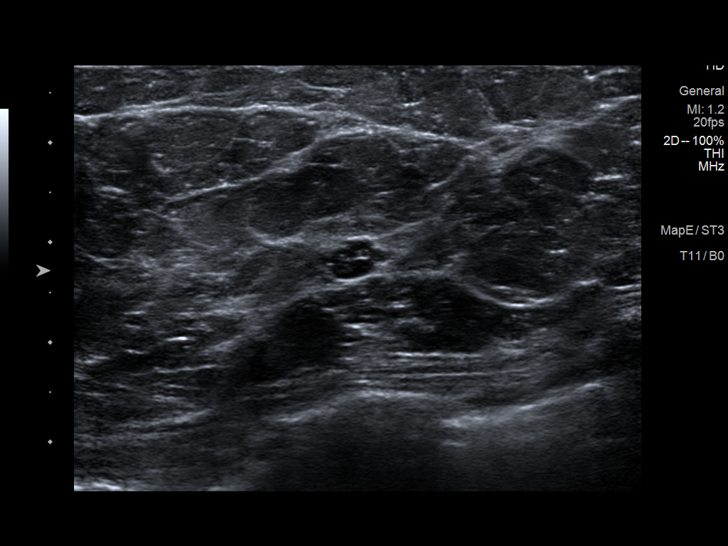
[im 10/11]
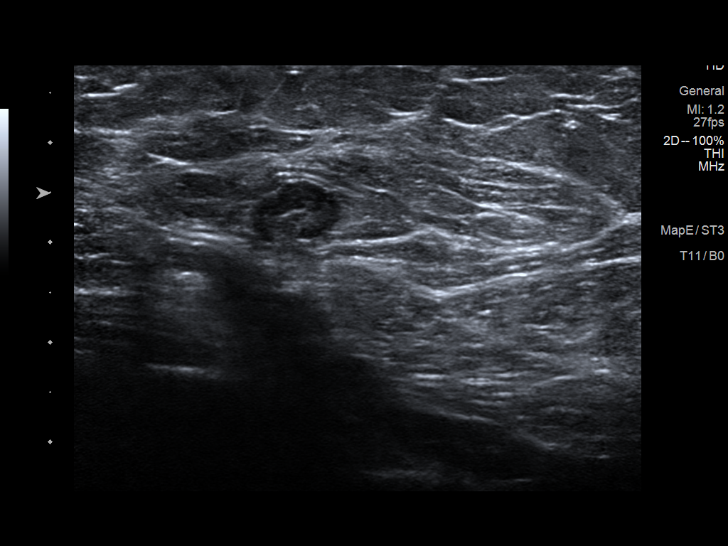
[im 11/11]
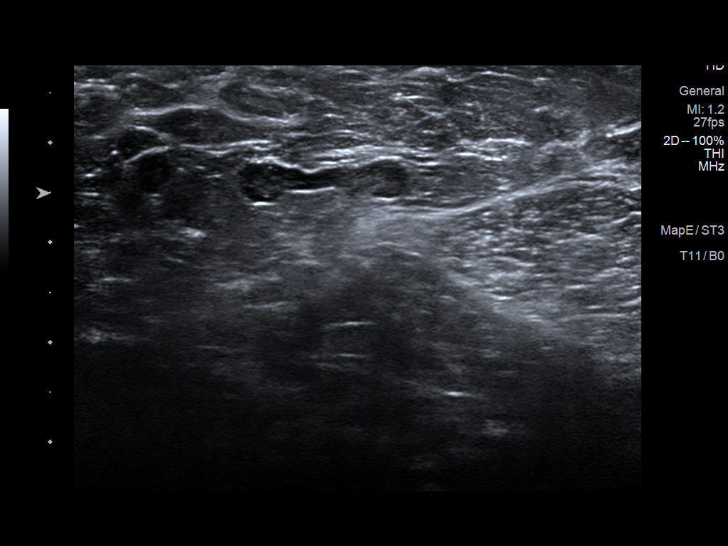

[11 of 11 positions shown; findings below may reference images not displayed]

FINDINGS: There is a new mass in the lateral inferior right breast. No
suspicious findings are seen on the left. No other evidence of
malignancy in either breast.

Mammographic images were processed with CAD.

On physical exam, no suspicious lumps are identified.

Targeted ultrasound is performed, showing a cluster of cysts in the
right breast at 8 o'clock, 7 cm from the nipple correlating with the
mammographically identified mass.
IMPRESSION: There is a cluster of cysts on the right requiring no additional
follow-up. No cause for the patient's pain identified. No suspicious
findings in either breast.

RECOMMENDATION:
Annual screening mammography. Treatment of the patient's diffuse
left breast pain should be based on clinical and physical exam given
lack of imaging findings.

I have discussed the findings and recommendations with the patient.
If applicable, a reminder letter will be sent to the patient
regarding the next appointment.

BI-RADS CATEGORY  2: Benign.

## 2021-12-07 ENCOUNTER — Ambulatory Visit
Admission: EM | Admit: 2021-12-07 | Discharge: 2021-12-07 | Disposition: A | Discharge status: Home / Self Care | Payer: Commercial Managed Care - PPO

## 2021-12-07 DIAGNOSIS — A084 Viral intestinal infection, unspecified: Secondary | ICD-10-CM | POA: Diagnosis not present

## 2021-12-07 DIAGNOSIS — R197 Diarrhea, unspecified: Secondary | ICD-10-CM

## 2021-12-07 DIAGNOSIS — R110 Nausea: Secondary | ICD-10-CM

## 2021-12-07 MED ORDER — ONDANSETRON 4 MG PO TBDP: 4.0000 mg | ORAL_TABLET | Freq: Three times a day (TID) | ORAL | 0 refills | Status: AC | PRN

## 2021-12-07 NOTE — ED Triage Notes (Signed)
Pt c/o nausea, lightheaded onset Friday that hasn't been relieved over the weekend.

## 2021-12-07 NOTE — ED Provider Notes (Signed)
[facility]    CSN: [id number] Arrival date & time: 12/07/21  1113      History   Chief Complaint Chief Complaint  Patient presents with   Nausea    HPI Kelly Hartman is a 56 y.o. female.   Patient presents with nausea and diarrhea that started about 3 days ago.  Denies any vomiting.  Denies blood in stool.  Patient has been eating and drinking fluids appropriately.  Denies any associated fever, body aches, chills, upper respiratory symptoms, cough.  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  Denies any associated abdominal pain.     Past Medical History:  Diagnosis Date   Anemia    Fibroids    History of blood transfusion 2010, 2011   [facility]   Hypertension    Migraine    Scoliosis    Shortness of breath    with exertion-d/t anemia    Patient Active Problem List   Diagnosis Date Noted   Essential hypertension 10/11/2019   Fibroids     Past Surgical History:  Procedure Laterality Date   ABDOMINAL HYSTERECTOMY  04/07/2012   Procedure: HYSTERECTOMY ABDOMINAL;  Surgeon: Claiborne Billings A. Pamala Hurry, MD;  Location: [facility];  Service: Gynecology;  Laterality: N/A;  Requests 2 hrs.   CESAREAN SECTION  x2    OB History   No obstetric history on file.      Home Medications    Prior to Admission medications   Medication Sig Start Date End Date Taking? Authorizing Provider  ondansetron (ZOFRAN-ODT) 4 MG disintegrating tablet Take 1 tablet (4 mg total) by mouth every 8 (eight) hours as needed for nausea or vomiting. 12/07/21  Yes Iara Monds, Hildred Alamin E, FNP  azelastine (OPTIVAR) 0.05 % ophthalmic solution Place 1 drop into both eyes 2 (two) times daily. 01/03/21   Jaynee Eagles, PA-C  celecoxib (CELEBREX) 200 MG capsule Take 200 mg by mouth 2 (two) times daily. 09/08/21   [provider]  diazepam (VALIUM) 2 MG tablet Take 2 mg by mouth every 8 (eight) hours as needed. 07/22/21    [provider]  enalapril-hydrochlorothiazide (VASERETIC) 10-25 MG tablet Take 1 tablet by mouth daily. 05/15/19   Tasia Catchings, Amy V, PA-C  fluticasone (FLONASE) 50 MCG/ACT nasal spray Place 2 sprays into both nostrils daily. 01/03/21   Jaynee Eagles, PA-C  levocetirizine (XYZAL) 5 MG tablet Take 1 tablet (5 mg total) by mouth every evening. 01/03/21   Jaynee Eagles, PA-C  traMADol (ULTRAM) 50 MG tablet Take 1 tablet (50 mg total) by mouth every 6 (six) hours as needed (headache). 08/31/20   Vanessa Kick, MD    Family History History reviewed. No pertinent family history.  Social History Social History   Tobacco Use   Smoking status: Never   Smokeless tobacco: Never  Substance Use Topics   Alcohol use: No   Drug use: No     Allergies   Aspirin, Cyclobenzaprine, and Fentanyl   Review of Systems Review of Systems Per HPI  Physical Exam Triage Vital Signs ED Triage Vitals [12/07/21 1125]  Enc Vitals Group     BP (!) 148/91     Pulse Rate 63     Resp 18     Temp 98 F (36.7 C)     Temp Source Oral     SpO2 95 %     Weight      Height  Head Circumference      Peak Flow      Pain Score 0     Pain Loc      Pain Edu?      Excl. in [place]?    No data found.  Updated Vital Signs BP (!) 148/91 (BP Location: Left Arm)   Pulse 63   Temp 98 F (36.7 C) (Oral)   Resp 18   LMP 02/15/2012   SpO2 95%   Visual Acuity Right Eye Distance:   Left Eye Distance:   Bilateral Distance:    Right Eye Near:   Left Eye Near:    Bilateral Near:     Physical Exam Constitutional:      General: She is not in acute distress.    Appearance: Normal appearance. She is not toxic-appearing or diaphoretic.  HENT:     Head: Normocephalic and atraumatic.     Mouth/Throat:     Mouth: Mucous membranes are moist.     Pharynx: No posterior oropharyngeal erythema.  Eyes:     Extraocular Movements: Extraocular movements intact.     Conjunctiva/sclera: Conjunctivae normal.   Cardiovascular:     Rate and Rhythm: Normal rate and regular rhythm.     Pulses: Normal pulses.     Heart sounds: Normal heart sounds.  Pulmonary:     Effort: Pulmonary effort is normal. No respiratory distress.     Breath sounds: Normal breath sounds.  Abdominal:     General: Bowel sounds are normal. There is no distension.     Palpations: Abdomen is soft.     Tenderness: There is no abdominal tenderness.  Neurological:     General: No focal deficit present.     Mental Status: She is alert and oriented to person, place, and time. Mental status is at baseline.  Psychiatric:        Mood and Affect: Mood normal.        Behavior: Behavior normal.        Thought Content: Thought content normal.        Judgment: Judgment normal.      UC Treatments / Results  Labs (all labs ordered are listed, but only abnormal results are displayed) Labs Reviewed - No data to display  EKG   Radiology No results found.  Procedures Procedures (including critical care time)  Medications Ordered in UC Medications - No data to display  Initial Impression / Assessment and Plan / UC Course  I have reviewed the triage vital signs and the nursing notes.  Pertinent labs & imaging results that were available during my care of the patient were reviewed by me and considered in my medical decision making (see chart for details).     Suspect viral cause to patient's symptoms.  Will treat with ondansetron to take as needed for nausea.  Patient advised to avoid taking tramadol and ondansetron in the same day.  No signs of dehydration on exam.  Patient advised to ensure adequate fluid hydration.  Patient was advised to go to the hospital if symptoms persist or worsen.  Patient verbalized understanding and was agreeable with plan. Final Clinical Impressions(s) / UC Diagnoses   Final diagnoses:  Viral gastroenteritis  Nausea without vomiting  Diarrhea, unspecified type     Discharge Instructions       It appears that you have a stomach virus. You have been prescribed a nausea medication to take as needed.  Please follow-up if symptoms persist or worsen or if you  not able to keep any food or fluids down.    ED Prescriptions     Medication Sig Dispense Auth. Provider   ondansetron (ZOFRAN-ODT) 4 MG disintegrating tablet Take 1 tablet (4 mg total) by mouth every 8 (eight) hours as needed for nausea or vomiting. 20 tablet Tuttle, Michele Rockers, [place]      PDMP not reviewed this encounter.   Teodora Medici, [place] 12/07/21 1150

## 2021-12-07 NOTE — Discharge Instructions (Signed)
It appears that you have a stomach virus. You have been prescribed a nausea medication to take as needed.  Please follow-up if symptoms persist or worsen or if you not able to keep any food or fluids down.

## 2022-12-18 ENCOUNTER — Encounter: Payer: Self-pay | Admitting: Cardiology

## 2022-12-18 ENCOUNTER — Ambulatory Visit: Payer: 59 | Admitting: Cardiology

## 2022-12-18 VITALS — BP 148/92 | HR 68 | Resp 16 | Ht 63.0 in

## 2022-12-18 DIAGNOSIS — E782 Mixed hyperlipidemia: Secondary | ICD-10-CM | POA: Insufficient documentation

## 2022-12-18 DIAGNOSIS — R072 Precordial pain: Secondary | ICD-10-CM

## 2022-12-18 DIAGNOSIS — I10 Essential (primary) hypertension: Secondary | ICD-10-CM

## 2022-12-18 DIAGNOSIS — R0789 Other chest pain: Secondary | ICD-10-CM

## 2022-12-18 NOTE — Progress Notes (Signed)
Patient referred by [facility] for chest pain  Subjective:   Kelly Hartman, female    DOB: Aug 11, 1965, 57 y.o.   MRN: [id number]   Chief Complaint  Patient presents with   Chest Pain   New Patient (Initial Visit)     HPI  57 y.o. 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  For last few weeks, she has been having episodes of retrosternal burning sensation lasting for few seconds, both at rest and exertion, but no worse with exertion.  She walks a mile a day, and has not noticed any worsening of symptoms with walking.  Blood pressure typically runs around 140 SBP.  She does not smoke, drinks 2 caffeine drinks a day, does not drink alcohol regularly.  No significant family history of early CAD.   Past Medical History:  Diagnosis Date   Anemia    Fibroids    History of blood transfusion 2010, 2011   [facility]   Hypertension    Migraine    Scoliosis    Shortness of breath    with exertion-d/t anemia     Past Surgical History:  Procedure Laterality Date   ABDOMINAL HYSTERECTOMY  04/07/2012   Procedure: HYSTERECTOMY ABDOMINAL;  Surgeon: Tresa Endo A. Ernestina Penna, MD;  Location: [facility];  Service: Gynecology;  Laterality: N/A;  Requests 2 hrs.   CESAREAN SECTION  x2     Social History   Tobacco Use  Smoking Status Never  Smokeless Tobacco Never    Social History   Substance and Sexual Activity  Alcohol Use No     Family History  Problem Relation Age of Onset   Bone cancer Mother    Cirrhosis Father    Diabetes Mellitus II Maternal Grandmother    Stroke Maternal Grandfather       Current Outpatient Medications:    azelastine (OPTIVAR) 0.05 % ophthalmic solution, Place 1 drop into both eyes 2 (two) times daily., Disp: 6 mL, Rfl: 12   celecoxib (CELEBREX) 200 MG capsule, Take 200 mg by mouth 2 (two) times  daily., Disp: , Rfl:    diazepam (VALIUM) 2 MG tablet, Take 2 mg by mouth every 8 (eight) hours as needed., Disp: , Rfl:    enalapril-hydrochlorothiazide (VASERETIC) 10-25 MG tablet, Take 1 tablet by mouth daily., Disp: 30 tablet, Rfl: 0   fluticasone (FLONASE) 50 MCG/ACT nasal spray, Place 2 sprays into both nostrils daily., Disp: 16 g, Rfl: 12   levocetirizine (XYZAL) 5 MG tablet, Take 1 tablet (5 mg total) by mouth every evening., Disp: 90 tablet, Rfl: 0   ondansetron (ZOFRAN-ODT) 4 MG disintegrating tablet, Take 1 tablet (4 mg total) by mouth every 8 (eight) hours as needed for nausea or vomiting., Disp: 20 tablet, Rfl: 0   traMADol (ULTRAM) 50 MG tablet, Take 1 tablet (50 mg total) by mouth every 6 (six) hours as needed (headache)., Disp: 8 tablet, Rfl: 0   Cardiovascular and other pertinent studies:  Reviewed external labs and tests, independently interpreted  EKG 12/18/2022: Sinus rhythm 63 bpm  LVH Nonspecific T wave abnormality    Recent labs: 12/10/2021: Glucose 104, BUN/Cr 8/0.7. EGFR 115. Na/K 140/4.1. Rest of the CMP normal H/H 13/39. MCV 92. Platelets 296  04/2021: Chol 172, TG 46, HDL 48, LDL 115 TSH 0.3 normal    Review of Systems  Cardiovascular:  Positive for chest pain.  Negative for dyspnea on exertion, leg swelling, palpitations and syncope.         Vitals:   12/18/22 0947 12/18/22 1001  BP: (!) 161/91 (!) 148/92  Pulse: 69 68  Resp: 16   SpO2: 96% 95%     Body mass index is 26.04 kg/m.   Objective:   Physical Exam Vitals and nursing note reviewed.  Constitutional:      General: She is not in acute distress. Neck:     Vascular: No JVD.  Cardiovascular:     Rate and Rhythm: Normal rate and regular rhythm.     Heart sounds: Normal heart sounds. No murmur heard. Pulmonary:     Effort: Pulmonary effort is normal.     Breath sounds: Normal breath sounds. No wheezing or rales.  Musculoskeletal:     Right lower leg: No edema.     Left lower  leg: No edema.          Visit diagnoses:   ICD-10-CM   1. Precordial pain  R07.2 PCV ECHOCARDIOGRAM COMPLETE    PCV CARDIAC STRESS TEST    CT CARDIAC SCORING (SELF PAY ONLY)    Lipid panel    2. Essential hypertension  I10     3. Mixed hyperlipidemia  E78.2     4. Atypical chest pain  R07.89 EKG 12-Lead       Orders Placed This Encounter  Procedures   CT CARDIAC SCORING (SELF PAY ONLY)   Lipid panel   PCV CARDIAC STRESS TEST   EKG 12-Lead   PCV ECHOCARDIOGRAM COMPLETE       Assessment & Recommendations:   57 y.o. African-American female with hypertension, hyperlipidemia, h/o anemia and fibroids, referred for evaluation of chest pain  Chest pain: Retrosternal burning sensation, unlikely to be angina given the duration of symptoms, and no exertional component. Recommend exercise treadmill stress test, echocardiogram, CT cardiac scoring, lipid panel. Follow-up, if any significant abnormalities found on above testing.  Hypertension: Uncontrolled.  Discussed increasing physical activity, low-salt diet, reducing caffeine intake. I encouraged her to keep blood pressure log and take it to her PCP, upcoming office visit with them in August. If blood pressure remains uncontrolled, could add amlodipine 5 mg daily.  Mixed hyperlipidemia: Last known LDL 115 in 2022.  Repeat lipid panel, check calcium score scan.  Further recommendations after above testing.   Thank you for referring the patient to [place]. Please feel free to contact with any questions.   Elder Negus, MD Pager: [phone] Office: [phone]

## 2022-12-24 LAB — LIPID PANEL
Chol/HDL Ratio: 3.4 ratio (ref 0.0–4.4)
Cholesterol, Total: 151 mg/dL (ref 100–199)
HDL: 45 mg/dL (ref >39)
LDL Chol Calc (NIH): 94 mg/dL (ref 0–99)
Triglycerides: 61 mg/dL (ref 0–149)
VLDL Cholesterol Cal: 12 mg/dL (ref 5–40)

## 2023-01-05 ENCOUNTER — Ambulatory Visit: Payer: 59

## 2023-01-05 DIAGNOSIS — R072 Precordial pain: Secondary | ICD-10-CM

## 2023-01-07 NOTE — Progress Notes (Signed)
Called patient no answer left a vm

## 2023-01-11 NOTE — Progress Notes (Signed)
Called patient to inform her about her stress test. Patient understood.

## 2023-01-13 ENCOUNTER — Ambulatory Visit: Payer: 59

## 2023-01-13 DIAGNOSIS — R072 Precordial pain: Secondary | ICD-10-CM

## 2023-01-22 NOTE — Progress Notes (Signed)
Called patient no answer left a vm

## 2023-01-22 NOTE — Progress Notes (Signed)
Called and spoke with patient regarding her echocardiogram results.

## 2023-05-07 ENCOUNTER — Inpatient Hospital Stay (HOSPITAL_BASED_OUTPATIENT_CLINIC_OR_DEPARTMENT_OTHER): Admission: RE | Admit: 2023-05-07 | Payer: Commercial Managed Care - PPO | Source: Ambulatory Visit

## 2023-05-12 ENCOUNTER — Encounter: Payer: Self-pay | Admitting: Emergency Medicine

## 2023-05-12 ENCOUNTER — Emergency Department
Admission: EM | Admit: 2023-05-12 | Discharge: 2023-05-12 | Disposition: A | Discharge status: Home / Self Care | Payer: Managed Care, Other (non HMO) | Attending: Emergency Medicine | Admitting: Emergency Medicine

## 2023-05-12 ENCOUNTER — Other Ambulatory Visit: Payer: Self-pay

## 2023-05-12 DIAGNOSIS — M542 Cervicalgia: Secondary | ICD-10-CM | POA: Diagnosis not present

## 2023-05-12 DIAGNOSIS — I10 Essential (primary) hypertension: Secondary | ICD-10-CM | POA: Insufficient documentation

## 2023-05-12 LAB — CBC WITH DIFFERENTIAL/PLATELET
Abs Immature Granulocytes: 0.02 10*3/uL (ref 0.00–0.07)
Basophils Absolute: 0.1 10*3/uL (ref 0.0–0.1)
Basophils Relative: 1 %
Eosinophils Absolute: 0.2 10*3/uL (ref 0.0–0.5)
Eosinophils Relative: 3 %
HCT: 40 % (ref 36.0–46.0)
Hemoglobin: 13.4 g/dL (ref 12.0–15.0)
Immature Granulocytes: 0 %
Lymphocytes Relative: 50 %
Lymphs Abs: 3.5 10*3/uL (ref 0.7–4.0)
MCH: 30 pg (ref 26.0–34.0)
MCHC: 33.5 g/dL (ref 30.0–36.0)
MCV: 89.7 fL (ref 80.0–100.0)
Monocytes Absolute: 0.5 10*3/uL (ref 0.1–1.0)
Monocytes Relative: 8 %
Neutro Abs: 2.6 10*3/uL (ref 1.7–7.7)
Neutrophils Relative %: 38 %
Platelets: 328 10*3/uL (ref 150–400)
RBC: 4.46 MIL/uL (ref 3.87–5.11)
RDW: 12.3 % (ref 11.5–15.5)
WBC: 6.9 10*3/uL (ref 4.0–10.5)
nRBC: 0 % (ref 0.0–0.2)

## 2023-05-12 LAB — BASIC METABOLIC PANEL
Anion gap: 6 (ref 5–15)
BUN: 11 mg/dL (ref 6–20)
CO2: 27 mmol/L (ref 22–32)
Calcium: 9 mg/dL (ref 8.9–10.3)
Chloride: 104 mmol/L (ref 98–111)
Creatinine, Ser: 0.8 mg/dL (ref 0.44–1.00)
GFR, Estimated: >60 mL/min (ref >60)
Glucose, Bld: 116 mg/dL — ABNORMAL HIGH (ref 70–99)
Potassium: 4.1 mmol/L (ref 3.5–5.1)
Sodium: 137 mmol/L (ref 135–145)

## 2023-05-12 LAB — TROPONIN I (HIGH SENSITIVITY): Troponin I (High Sensitivity): 3 ng/L (ref <18)

## 2023-05-12 NOTE — ED Triage Notes (Signed)
Patient to ED via POV for hypertension. Seen by PCP last week for same. States BP in the 150's/90's at home. Has been taking BP meds at home as prescribed. States also having pressure in left side of neck.

## 2023-05-12 NOTE — Discharge Instructions (Signed)
Please seek medical attention for any high fevers, chest pain, shortness of breath, change in behavior, persistent vomiting, bloody stool or any other new or concerning symptoms.  

## 2023-05-12 NOTE — ED Notes (Signed)
Pt d/c home per EDP order. Discharge summary reviewed, verbalize understanding. NAD

## 2023-05-12 NOTE — ED Provider Notes (Signed)
[facility] Provider Note    Event Date/Time   First MD Initiated Contact with Patient 05/12/23 [phone]     (approximate)   History   Hypertension   HPI  Kelly Hartman is a 57 y.o. female resents to the emergency department today because of concerns for high blood pressure and left sided neck pain.  Patient has been on blood pressure medication for years.  However about a week ago she noticed that it was running higher than normal.  She did have some lightheadedness at that time.  Saw her primary care doctor for this.  They adjusted her medications.  Today she continued to have some dizziness and lightheadedness.  Checked her blood pressure and again it was elevated.  In addition patient is complaining of some burning left neck pain that does go down her arm.  She denies any unusual activity or exertion.     Physical Exam   Triage Vital Signs: ED Triage Vitals [05/12/23 0906]  Encounter Vitals Group     BP (!) 149/101     Systolic BP Percentile      Diastolic BP Percentile      Pulse Rate 67     Resp 18     Temp 98.1 F (36.7 C)     Temp Source Oral     SpO2 98 %     Weight 148 lb (67.1 kg)     Height 5' (1.524 m)     Head Circumference      Peak Flow      Pain Score 7     Pain Loc      Pain Education      Exclude from Growth Chart     Most recent vital signs: Vitals:   05/12/23 0906  BP: (!) 149/101  Pulse: 67  Resp: 18  Temp: 98.1 F (36.7 C)  SpO2: 98%   General: Awake, alert, oriented. CV:  Good peripheral perfusion. Regular rate and rhythm. Resp:  Normal effort. Lungs clear. Abd:  No distention.  Other:  Radial pulse 2+ in bilateral arms. No cervical tenderness.    ED Results / Procedures / Treatments   Labs (all labs ordered are listed, but only abnormal results are displayed) Labs Reviewed  BASIC METABOLIC PANEL - Abnormal; Notable for the following components:      Result Value   Glucose, Bld 116 (*)    All other  components within normal limits  CBC WITH DIFFERENTIAL/PLATELET  TROPONIN I (HIGH SENSITIVITY)     EKG  I, Phineas Semen, attending physician, personally viewed and interpreted this EKG  EKG Time: 1054 Rate: 60 Rhythm: normal sinus rhythm Axis: left axis deviation Intervals: qtc 440 QRS: LVH ST changes: no st elevation Impression: abnormal ekg   RADIOLOGY None  PROCEDURES:  Critical Care performed: No    MEDICATIONS ORDERED IN ED: Medications - No data to display   IMPRESSION / MDM / ASSESSMENT AND PLAN / ED COURSE  I reviewed the triage vital signs and the nursing notes.                              Differential diagnosis includes, but is not limited to, hypertension, ACS, cervical sprain, radiculopathy, dissection  Patient's presentation is most consistent with acute presentation with potential threat to life or bodily function.   Patient presented to the emergency department today because of concerns for high blood pressure.  Patient's  blood pressure was elevated here in the emergency department. Blood work without elevation of creatinine or troponin. At this time think unlikely any acute organ injury. Discussed with patient importance of long term blood pressure control. Think it is reasonable for patient to be discharged to continue blood pressure management through PCP.     FINAL CLINICAL IMPRESSION(S) / ED DIAGNOSES   Final diagnoses:  Hypertension, unspecified type   Note:  This document was prepared using Dragon voice recognition software and may include unintentional dictation errors.    Phineas Semen, MD 05/12/23 [phone]
